# Patient Record
Sex: Male | Born: 1985 | Race: White | Hispanic: No | Marital: Married | Smoking: Never smoker
Health system: Southern US, Community
[De-identification: ages and names within clinical notes are randomized; demographics above are authoritative.]

## PROBLEM LIST (undated history)

## (undated) DIAGNOSIS — D734 Cyst of spleen: Secondary | ICD-10-CM

## (undated) DIAGNOSIS — K760 Fatty (change of) liver, not elsewhere classified: Secondary | ICD-10-CM

## (undated) DIAGNOSIS — K219 Gastro-esophageal reflux disease without esophagitis: Secondary | ICD-10-CM

## (undated) HISTORY — PX: NO PAST SURGERIES: SHX2092

---
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
## 2015-05-14 ENCOUNTER — Emergency Department
Admission: EM | Admit: 2015-05-14 | Discharge: 2015-05-14 | Disposition: A | Discharge status: Home / Self Care | Payer: Managed Care, Other (non HMO) | Attending: Emergency Medicine | Admitting: Emergency Medicine

## 2015-05-14 DIAGNOSIS — H6993 Unspecified Eustachian tube disorder, bilateral: Secondary | ICD-10-CM | POA: Diagnosis not present

## 2015-05-14 DIAGNOSIS — J309 Allergic rhinitis, unspecified: Secondary | ICD-10-CM | POA: Diagnosis not present

## 2015-05-14 DIAGNOSIS — H6983 Other specified disorders of Eustachian tube, bilateral: Secondary | ICD-10-CM

## 2015-05-14 DIAGNOSIS — H9203 Otalgia, bilateral: Secondary | ICD-10-CM | POA: Diagnosis present

## 2015-05-14 MED ORDER — FLUTICASONE PROPIONATE 50 MCG/ACT NA SUSP: 1.0000 | Freq: Two times a day (BID) | NASAL | Status: DC

## 2015-05-14 MED ORDER — CETIRIZINE HCL 10 MG PO TABS: 10.0000 mg | ORAL_TABLET | Freq: Every day | ORAL | Status: DC

## 2015-05-14 NOTE — Discharge Instructions (Signed)
Allergic Rhinitis Allergic rhinitis is when the mucous membranes in the nose respond to allergens. Allergens are particles in the air that cause your body to have an allergic reaction. This causes you to release allergic antibodies. Through a chain of events, these eventually cause you to release histamine into the blood stream. Although meant to protect the body, it is this release of histamine that causes your discomfort, such as frequent sneezing, congestion, and an itchy, runny nose.  CAUSES Seasonal allergic rhinitis (hay fever) is caused by pollen allergens that may come from grasses, trees, and weeds. Year-round allergic rhinitis (perennial allergic rhinitis) is caused by allergens such as house dust mites, pet dander, and mold spores. SYMPTOMS  Nasal stuffiness (congestion).  Itchy, runny nose with sneezing and tearing of the eyes. DIAGNOSIS Your health care provider can help you determine the allergen or allergens that trigger your symptoms. If you and your health care provider are unable to determine the allergen, skin or blood testing may be used. Your health care provider will diagnose your condition after taking your health history and performing a physical exam. Your health care provider may assess you for other related conditions, such as asthma, pink eye, or an ear infection. TREATMENT Allergic rhinitis does not have a cure, but it can be controlled by:  Medicines that block allergy symptoms. These may include allergy shots, nasal sprays, and oral antihistamines.  Avoiding the allergen. Hay fever may often be treated with antihistamines in pill or nasal spray forms. Antihistamines block the effects of histamine. There are over-the-counter medicines that may help with nasal congestion and swelling around the eyes. Check with your health care provider before taking or giving this medicine. If avoiding the allergen or the medicine prescribed do not work, there are many new medicines  your health care provider can prescribe. Stronger medicine may be used if initial measures are ineffective. Desensitizing injections can be used if medicine and avoidance does not work. Desensitization is when a patient is given ongoing shots until the body becomes less sensitive to the allergen. Make sure you follow up with your health care provider if problems continue. HOME CARE INSTRUCTIONS It is not possible to completely avoid allergens, but you can reduce your symptoms by taking steps to limit your exposure to them. It helps to know exactly what you are allergic to so that you can avoid your specific triggers. SEEK MEDICAL CARE IF:  You have a fever.  You develop a cough that does not stop easily (persistent).  You have shortness of breath.  You start wheezing.  Symptoms interfere with normal daily activities.   This information is not intended to replace advice given to you by your health care provider. Make sure you discuss any questions you have with your health care provider.   Document Released: 10/12/2000 Document Revised: 02/07/2014 Document Reviewed: 09/24/2012 Elsevier Interactive Patient Education 2016 [employer]     Barotitis Media Barotitis media is inflammation of your middle ear. This occurs when the auditory tube (eustachian tube) leading from the back of your nose (nasopharynx) to your eardrum is blocked. This blockage may result from a cold, environmental allergies, or an upper respiratory infection. Unresolved barotitis media may lead to damage or hearing loss (barotrauma), which may become permanent. HOME CARE INSTRUCTIONS   Use medicines as recommended by your health care provider. Over-the-counter medicines will help unblock the canal and can help during times of air travel.  Do not put anything into your ears to clean   or unplug them. Eardrops will not be helpful.  Do not swim, dive, or fly until your health care provider says it is all right to do so.  If these activities are necessary, chewing gum with frequent, forceful swallowing may help. It is also helpful to hold your nose and gently blow to pop your ears for equalizing pressure changes. This forces air into the eustachian tube.  Only take over-the-counter or prescription medicines for pain, discomfort, or fever as directed by your health care provider.  A decongestant may be helpful in decongesting the middle ear and make pressure equalization easier. SEEK MEDICAL CARE IF:  You experience a serious form of dizziness in which you feel as if the room is spinning and you feel nauseated (vertigo).  Your symptoms only involve one ear. SEEK IMMEDIATE MEDICAL CARE IF:   You develop a severe headache, dizziness, or severe ear pain.  You have bloody or pus-like drainage from your ears.  You develop a fever.  Your problems do not improve or become worse. MAKE SURE YOU:   Understand these instructions.  Will watch your condition.  Will get help right away if you are not doing well or get worse.   This information is not intended to replace advice given to you by your health care provider. Make sure you discuss any questions you have with your health care provider.   Document Released: 01/15/2000 Document Revised: 11/07/2012 Document Reviewed: 08/14/2012 Elsevier Interactive Patient Education 2016 [employer]vier Inc.  

## 2015-05-14 NOTE — ED Notes (Signed)
Pt reports left ear pain X 3 weeks. Pt reports having had similar pain of the right ear approx 1 year ago that was due to an abscessed tooth. Pt currently rates pain at a 3 out of 10 described as throbbing. Pt reports the pain goes intermittently to a 6-7. Pt reports slight loss of hearing in ear. Pt denies drainage, warmth, redness and fever.  Pt reports 2 episodes of emeses during the night - pt does not know if this is related to his symptoms.

## 2015-05-14 NOTE — ED Provider Notes (Signed)
[facility] Emergency Department Provider Note  ____________________________________________  Time seen: Approximately 10:39 PM  I have reviewed the triage vital signs and the nursing notes.   HISTORY  Chief Complaint Otalgia    HPI Dustin Love is a 30 y.o. male who presents emergency department complaining of intermittent left and right ear pain. Patient states that symptoms have been ongoing 3 weeks. Patient is concerned that "I might have an infection my mouth is causing pain in my ear. Patient denies any oral pain. He denies any oral swelling. Patient states that he has not had any fevers or chills, neck pain, chest pain, shortness of breath, nausea vomiting. Patient has not been taking any medications for this complaint. Patient denies any headache or visual acuity changes. He does endorse some seasonal allergies with nasal congestion and sneezing.   History reviewed. No pertinent past medical history.  There are no active problems to display for this patient.   History reviewed. No pertinent past surgical history.  Current Outpatient Rx  Name  Route  Sig  Dispense  Refill  . cetirizine (ZYRTEC) 10 MG tablet   Oral   Take 1 tablet (10 mg total) by mouth daily.   30 tablet   0   . fluticasone (FLONASE) 50 MCG/ACT nasal spray   Each Nare   Place 1 spray into both nostrils 2 (two) times daily.   16 g   0     Allergies Amoxicillin  History reviewed. No pertinent family history.  Social History Social History  Substance Use Topics  . Smoking status: Never Smoker   . Smokeless tobacco: None  . Alcohol Use: Yes     Comment: occasional drinking, nothing in over a month     Review of Systems  Constitutional: No fever/chills Eyes: No visual changes. No discharge ENT: No sore throat.Positive for nasal congestion. Positive for sneezing. Positive for bilateral intermittent ear pain. Cardiovascular: no chest pain. Respiratory: no  cough. No SOB. Musculoskeletal: Negative for back pain. Skin: Negative for rash. Neurological: Negative for headaches, focal weakness or numbness. 10-point ROS otherwise negative.  ____________________________________________   PHYSICAL EXAM:  VITAL SIGNS: ED Triage Vitals  Enc Vitals Group     BP 05/14/15 2150 150/82 mmHg     Pulse Rate 05/14/15 2150 68     Resp 05/14/15 2150 18     Temp 05/14/15 2150 97.8 F (36.6 C)     Temp Source 05/14/15 2150 Oral     SpO2 05/14/15 2150 100 %     Weight 05/14/15 2150 180 lb (81.647 kg)     Height 05/14/15 2150  (1.778 m)     Head Cir --      Peak Flow --      Pain Score 05/14/15 2213 3     Pain Loc --      Pain Edu? --      Excl. in GC? --      Constitutional: Alert and oriented. Well appearing and in no acute distress. Eyes: Conjunctivae are normal. PERRL. EOMI. Head: Atraumatic. ENT:      Ears: EACs are unremarkable bilaterally. TMs are mildly bulging. No air-fluid level noted.      Nose: Moderate congestion/rhinnorhea. Turbinates are boggy.      Mouth/Throat: Mucous membranes are moist. Oropharynx is not erythematous and not edematous. No signs of dental infection or abscess. Neck: No stridor.  Hematological/Lymphatic/Immunilogical: No cervical lymphadenopathy. Cardiovascular: Normal rate, regular rhythm. Normal S1 and S2.  Good peripheral circulation. Musculoskeletal: No lower extremity tenderness nor edema.  No joint effusions. Neurologic:  Normal speech and language. No gross focal neurologic deficits are appreciated.  Skin:  Skin is warm, dry and intact. No rash noted. Psychiatric: Mood and affect are normal. Speech and behavior are normal. Patient exhibits appropriate insight and judgement.   ____________________________________________   LABS (all labs ordered are listed, but only abnormal results are displayed)  Labs Reviewed - No data to  display ____________________________________________  EKG   ____________________________________________  RADIOLOGY   No results found.  ____________________________________________    PROCEDURES  Procedure(s) performed:       Medications - No data to display   ____________________________________________   INITIAL IMPRESSION / ASSESSMENT AND PLAN / ED COURSE  Pertinent labs & imaging results that were available during my care of the patient were reviewed by me and considered in my medical decision making (see chart for details).  Patient's diagnosis is consistent with allergic rhinitis with eustachian tube dysfunction. Patient will be discharged home with prescriptions for Zyrtec and Flonase. Patient is to follow up with primary care provider if symptoms persist past this treatment course. Patient is given ED precautions to return to the ED for any worsening or new symptoms.     ____________________________________________  FINAL CLINICAL IMPRESSION(S) / ED DIAGNOSES  Final diagnoses:  Allergic rhinitis, unspecified allergic rhinitis type  Eustachian tube dysfunction, bilateral      NEW MEDICATIONS STARTED DURING THIS VISIT:  New Prescriptions   CETIRIZINE (ZYRTEC) 10 MG TABLET    Take 1 tablet (10 mg total) by mouth daily.   FLUTICASONE (FLONASE) 50 MCG/ACT NASAL SPRAY    Place 1 spray into both nostrils 2 (two) times daily.        This chart was dictated using voice recognition software/Dragon. Despite best efforts to proofread, errors can occur which can change the meaning. Any change was purely unintentional.    Racheal PatchesJonathan D Rabab Currington, PA-C 05/14/15 2245  Phineas SemenGraydon Goodman, MD 05/14/15 2342

## 2015-05-14 NOTE — ED Notes (Signed)
Pt reports left ear pain radiating down the neck and up the head. Pt reports he thinks he has a infection of his left upper/lower molars. Pt also reports his daughter had a ear infection appro 3 weeks ago.

## 2015-08-16 ENCOUNTER — Encounter: Payer: Self-pay | Admitting: *Deleted

## 2015-08-16 ENCOUNTER — Emergency Department
Admission: EM | Admit: 2015-08-16 | Discharge: 2015-08-16 | Disposition: A | Discharge status: Home / Self Care | Payer: Managed Care, Other (non HMO) | Attending: Emergency Medicine | Admitting: Emergency Medicine

## 2015-08-16 DIAGNOSIS — R109 Unspecified abdominal pain: Secondary | ICD-10-CM | POA: Diagnosis present

## 2015-08-16 DIAGNOSIS — N200 Calculus of kidney: Secondary | ICD-10-CM | POA: Diagnosis not present

## 2015-08-16 DIAGNOSIS — Z79899 Other long term (current) drug therapy: Secondary | ICD-10-CM | POA: Insufficient documentation

## 2015-08-16 DIAGNOSIS — Z7951 Long term (current) use of inhaled steroids: Secondary | ICD-10-CM | POA: Insufficient documentation

## 2015-08-16 LAB — URINALYSIS COMPLETE WITH MICROSCOPIC (ARMC ONLY)
Bilirubin Urine: NEGATIVE
GLUCOSE, UA: NEGATIVE mg/dL
KETONES UR: NEGATIVE mg/dL
LEUKOCYTES UA: NEGATIVE
NITRITE: NEGATIVE
Protein, ur: NEGATIVE mg/dL
SPECIFIC GRAVITY, URINE: 1.016 (ref 1.005–1.030)
SQUAMOUS EPITHELIAL / LPF: NONE SEEN
pH: 7 (ref 5.0–8.0)

## 2015-08-16 LAB — BASIC METABOLIC PANEL
Anion gap: 5 (ref 5–15)
BUN: 10 mg/dL (ref 6–20)
CHLORIDE: 103 mmol/L (ref 101–111)
CO2: 30 mmol/L (ref 22–32)
CREATININE: 1.23 mg/dL (ref 0.61–1.24)
Calcium: 9.5 mg/dL (ref 8.9–10.3)
GFR calc Af Amer: >60 mL/min (ref >60)
GFR calc non Af Amer: >60 mL/min (ref >60)
GLUCOSE: 87 mg/dL (ref 65–99)
POTASSIUM: 4 mmol/L (ref 3.5–5.1)
SODIUM: 138 mmol/L (ref 135–145)

## 2015-08-16 LAB — CBC
HEMATOCRIT: 47.6 % (ref 40.0–52.0)
Hemoglobin: 16.6 g/dL (ref 13.0–18.0)
MCH: 30 pg (ref 26.0–34.0)
MCHC: 34.8 g/dL (ref 32.0–36.0)
MCV: 86.3 fL (ref 80.0–100.0)
PLATELETS: 161 10*3/uL (ref 150–440)
RBC: 5.51 MIL/uL (ref 4.40–5.90)
RDW: 13.2 % (ref 11.5–14.5)
WBC: 6.2 10*3/uL (ref 3.8–10.6)

## 2015-08-16 NOTE — ED Provider Notes (Addendum)
[facility] Emergency Department Provider Note   ____________________________________________  Time seen: Approximately 915 PM  I have reviewed the triage vital signs and the nursing notes.   HISTORY  Chief Complaint Flank Pain   HPI Dustin Love is a 30 y.o. male without any chronic medical conditions was presenting to the emergency department today with left flank pain that started suddenly at 12 PM. He says it then dissipated but then returned later in the day it is severe, cramping 10 out of 10 pain. He says that at that point he came to the emergency department. He said that he then had a feeling of a sharp pain at the tip of his penis and he went to urinate and the pain was relieved. He has never had a history of kidney stone. He does not have any family history of kidney stones. Did not report any fever. Says that he has had no pain since. Did not notice any blood in his urine.   History reviewed. No pertinent past medical history.  There are no active problems to display for this patient.   History reviewed. No pertinent past surgical history.  Current Outpatient Rx  Name  Route  Sig  Dispense  Refill  . cetirizine (ZYRTEC) 10 MG tablet   Oral   Take 1 tablet (10 mg total) by mouth daily.   30 tablet   0   . fluticasone (FLONASE) 50 MCG/ACT nasal spray   Each Nare   Place 1 spray into both nostrils 2 (two) times daily.   16 g   0     Allergies Amoxicillin  History reviewed. No pertinent family history.  Social History Social History  Substance Use Topics  . Smoking status: Never Smoker   . Smokeless tobacco: Never Used  . Alcohol Use: Yes     Comment: occasional drinking, nothing in over a month    Review of Systems Constitutional: No fever/chills Eyes: No visual changes. ENT: No sore throat. Cardiovascular: Denies chest pain. Respiratory: Denies shortness of breath. Gastrointestinal: No abdominal pain.  No  nausea, no vomiting.  No diarrhea.  No constipation. Genitourinary: Negative for dysuria. Musculoskeletal: As above Skin: Negative for rash. Neurological: Negative for headaches, focal weakness or numbness.  10-point ROS otherwise negative.  ____________________________________________   PHYSICAL EXAM:  VITAL SIGNS: ED Triage Vitals  Enc Vitals Group     BP 08/16/15 1928 149/91 mmHg     Pulse Rate 08/16/15 1928 70     Resp 08/16/15 1928 28     Temp 08/16/15 1928 98.2 F (36.8 C)     Temp Source 08/16/15 1928 Oral     SpO2 08/16/15 1928 100 %     Weight 08/16/15 1928 180 lb (81.647 kg)     Height 08/16/15 1928 5\' 11"  (1.803 m)     Head Cir --      Peak Flow --      Pain Score 08/16/15 1928 0     Pain Loc --      Pain Edu? --      Excl. in GC? --     Constitutional: Alert and oriented. Well appearing and in no acute distress. Eyes: Conjunctivae are normal. PERRL. EOMI. Head: Atraumatic. Nose: No congestion/rhinnorhea. Mouth/Throat: Mucous membranes are moist.   Neck: No stridor.   Cardiovascular: Normal rate, regular rhythm. Grossly normal heart sounds.   Respiratory: Normal respiratory effort.  No retractions. Lungs CTAB. Gastrointestinal: Soft and nontender. No distention.  No CVA tenderness. Genitourinary: Normal external appearance. No swelling or deformity. Circumcised male. No testicular swelling or tenderness. Musculoskeletal: No lower extremity tenderness nor edema.  No joint effusions. Neurologic:  Normal speech and language. No gross focal neurologic deficits are appreciated. No gait instability. Skin:  Skin is warm, dry and intact. No rash noted. Psychiatric: Mood and affect are normal. Speech and behavior are normal.  ____________________________________________   LABS (all labs ordered are listed, but only abnormal results are displayed)  Labs Reviewed  URINALYSIS COMPLETEWITH MICROSCOPIC ([facility] ONLY) - Abnormal; Notable for the following:    Color,  Urine YELLOW (*)    APPearance HAZY (*)    Hgb urine dipstick 3+ (*)    Bacteria, UA RARE (*)    All other components within normal limits  BASIC METABOLIC PANEL  CBC   ____________________________________________  EKG   ____________________________________________  RADIOLOGY   ____________________________________________   PROCEDURES   Procedures   ____________________________________________   INITIAL IMPRESSION / ASSESSMENT AND PLAN / ED COURSE  Pertinent labs & imaging results that were available during my care of the patient were reviewed by me and considered in my medical decision making (see chart for details).  Patient with history consistent with kidney stone with passage. Blood in the urine without signs of urinary tract infection. Very reassuring lab work. Will be discharged home. ____________________________________________   FINAL CLINICAL IMPRESSION(S) / ED DIAGNOSES  Kidney stone.    NEW MEDICATIONS STARTED DURING THIS VISIT:  New Prescriptions   No medications on file     Note:  This document was prepared using Dragon voice recognition software and may include unintentional dictation errors.    Myrna Blazer, MD 08/16/15 2132  rr 18 on my exam   Myrna Blazer, MD 08/16/15 2149

## 2015-08-16 NOTE — ED Notes (Signed)
Pt c/o L flank pain waking from sleep at noon. Pt c/o sensation of having to urinate that persists after urination. Pt states intermittent pain that is severe when having it. Pt denies n/v. Pt states he was uncomfortable in any position. Pt states he urinated 5 minutes ago and his pain was relieved.

## 2015-08-16 NOTE — Discharge Instructions (Signed)
Kidney Stones °Kidney stones (urolithiasis) are deposits that form inside your kidneys. The intense pain is caused by the stone moving through the urinary tract. When the stone moves, the ureter goes into spasm around the stone. The stone is usually passed in the urine.  °CAUSES  °· A disorder that makes certain neck glands produce too much parathyroid hormone (primary hyperparathyroidism). °· A buildup of uric acid crystals, similar to gout in your joints. °· Narrowing (stricture) of the ureter. °· A kidney obstruction present at birth (congenital obstruction). °· Previous surgery on the kidney or ureters. °· Numerous kidney infections. °SYMPTOMS  °· Feeling sick to your stomach (nauseous). °· Throwing up (vomiting). °· Blood in the urine (hematuria). °· Pain that usually spreads (radiates) to the groin. °· Frequency or urgency of urination. °DIAGNOSIS  °· Taking a history and physical exam. °· Blood or urine tests. °· CT scan. °· Occasionally, an examination of the inside of the urinary bladder (cystoscopy) is performed. °TREATMENT  °· Observation. °· Increasing your fluid intake. °· Extracorporeal shock wave lithotripsy--This is a noninvasive procedure that uses shock waves to break up kidney stones. °· Surgery may be needed if you have severe pain or persistent obstruction. There are various surgical procedures. Most of the procedures are performed with the use of small instruments. Only small incisions are needed to accommodate these instruments, so recovery time is minimized. °The size, location, and chemical composition are all important variables that will determine the proper choice of action for you. Talk to your health care provider to better understand your situation so that you will minimize the risk of injury to yourself and your kidney.  °HOME CARE INSTRUCTIONS  °· Drink enough water and fluids to keep your urine clear or pale yellow. This will help you to pass the stone or stone fragments. °· Strain  all urine through the provided strainer. Keep all particulate matter and stones for your health care provider to see. The stone causing the pain may be as small as a grain of salt. It is very important to use the strainer each and every time you pass your urine. The collection of your stone will allow your health care provider to analyze it and verify that a stone has actually passed. The stone analysis will often identify what you can do to reduce the incidence of recurrences. °· Only take over-the-counter or prescription medicines for pain, discomfort, or fever as directed by your health care provider. °· Keep all follow-up visits as told by your health care provider. This is important. °· Get follow-up X-rays if required. The absence of pain does not always mean that the stone has passed. It may have only stopped moving. If the urine remains completely obstructed, it can cause loss of kidney function or even complete destruction of the kidney. It is your responsibility to make sure X-rays and follow-ups are completed. Ultrasounds of the kidney can show blockages and the status of the kidney. Ultrasounds are not associated with any radiation and can be performed easily in a matter of minutes. °· Make changes to your daily diet as told by your health care provider. You may be told to: °¨ Limit the amount of salt that you eat. °¨ Eat 5 or more servings of fruits and vegetables each day. °¨ Limit the amount of meat, poultry, fish, and eggs that you eat. °· Collect a 24-hour urine sample as told by your health care provider. You may need to collect another urine sample every 6-12   months. °SEEK MEDICAL CARE IF: °· You experience pain that is progressive and unresponsive to any pain medicine you have been prescribed. °SEEK IMMEDIATE MEDICAL CARE IF:  °· Pain cannot be controlled with the prescribed medicine. °· You have a fever or shaking chills. °· The severity or intensity of pain increases over 18 hours and is not  relieved by pain medicine. °· You develop a new onset of abdominal pain. °· You feel faint or pass out. °· You are unable to urinate. °  °This information is not intended to replace advice given to you by your health care provider. Make sure you discuss any questions you have with your health care provider. °  °Document Released: 01/17/2005 Document Revised: 10/08/2014 Document Reviewed: 06/20/2012 °Elsevier Interactive Patient Education ©2016 [employer] Inc. ° °

## 2016-05-31 ENCOUNTER — Ambulatory Visit
Admission: EM | Admit: 2016-05-31 | Discharge: 2016-05-31 | Disposition: A | Discharge status: Home / Self Care | Payer: Commercial Managed Care - PPO | Attending: Family Medicine | Admitting: Family Medicine

## 2016-05-31 ENCOUNTER — Ambulatory Visit (INDEPENDENT_AMBULATORY_CARE_PROVIDER_SITE_OTHER): Payer: Commercial Managed Care - PPO

## 2016-05-31 DIAGNOSIS — H6501 Acute serous otitis media, right ear: Secondary | ICD-10-CM | POA: Diagnosis not present

## 2016-05-31 DIAGNOSIS — R05 Cough: Secondary | ICD-10-CM

## 2016-05-31 DIAGNOSIS — K219 Gastro-esophageal reflux disease without esophagitis: Secondary | ICD-10-CM

## 2016-05-31 DIAGNOSIS — R059 Cough, unspecified: Secondary | ICD-10-CM

## 2016-05-31 DIAGNOSIS — H65111 Acute and subacute allergic otitis media (mucoid) (sanguinous) (serous), right ear: Secondary | ICD-10-CM

## 2016-05-31 MED ORDER — PANTOPRAZOLE SODIUM 40 MG PO TBEC: 40.0000 mg | DELAYED_RELEASE_TABLET | Freq: Every day | ORAL | 0 refills | Status: DC

## 2016-05-31 NOTE — ED Triage Notes (Signed)
Patient complains of cough. Patient states that cough is constant x months. Patient states that he will cough after laughing and other instances. Patient states that cough is dry and occasionally causes nausea.

## 2016-05-31 NOTE — ED Provider Notes (Signed)
CSN: [id number]     Arrival date & time 05/31/16  0945 History   First MD Initiated Contact with Patient 05/31/16 [phone]     Chief Complaint  Patient presents with  . Cough   (Consider location/radiation/quality/duration/timing/severity/associated sxs/prior Treatment) HPI  31 year old male who presents with a cough has been constant for the last several months. The patient states he is actually had this since childhood has never had any workup regarding it. He has tried several things on his own. He was having burning in the back of his throat when he lie down which she thought was from soft drinks started taking acid reducers and this seemed to alleviate the problem of the burning but he continued to cough. Recently he has noticed that whenever he laughs for a prolonged period and will trigger a coughing paroxysm that will actually cause him to gag . His cough is dry. He has no history of cigarette smoking. He states that he has not lost weight recently in fact has gained weight. He has tried to decrease his soda intake and is actually down to 2 [facility] daily. Is no shortness of breath. His O2 sats are 100% on room air. Complaining of increased sinus issues.      History reviewed. No pertinent past medical history. Past Surgical History:  Procedure Laterality Date  . NO PAST SURGERIES     Family History  Problem Relation Age of Onset  . Diabetes Father   . Hypertension Father    Social History  Substance Use Topics  . Smoking status: Never Smoker  . Smokeless tobacco: Never Used  . Alcohol use Yes     Comment: occasional drinking, nothing in over a month    Review of Systems  Constitutional: Negative for activity change, appetite change, chills, fatigue and fever.  Respiratory: Positive for cough. Negative for shortness of breath, wheezing and stridor.   All other systems reviewed and are negative.   Allergies  Amoxicillin  Home Medications   Prior to Admission  medications   Medication Sig Start Date End Date Taking? Authorizing Provider  pantoprazole (PROTONIX) 40 MG tablet Take 1 tablet (40 mg total) by mouth daily. 05/31/16   Lutricia Feil, PA-C   Meds Ordered and Administered this Visit  Medications - No data to display  BP 140/71 (BP Location: Left Arm)   Pulse 88   Temp 98.2 F (36.8 C) (Oral)   Resp 18   Ht  (1.803 m)   Wt 175 lb (79.4 kg)   SpO2 100%   BMI 24.41 kg/m  No data found.   Physical Exam  Constitutional: He is oriented to person, place, and time. He appears well-developed and well-nourished. No distress.  HENT:  Head: Normocephalic and atraumatic.  Left Ear: External ear normal.  Nose: Nose normal.  Mouth/Throat: Oropharynx is clear and moist. No oropharyngeal exudate.  Examination of the right ear shows it to be very erythematous and bulging. Left TM appears normal.  Eyes: Pupils are equal, round, and reactive to light. Right eye exhibits no discharge. Left eye exhibits no discharge.  Neck: Normal range of motion.  Pulmonary/Chest: Effort normal and breath sounds normal. No respiratory distress. He has no wheezes. He has no rales.  Musculoskeletal: Normal range of motion.  Lymphadenopathy:    He has no cervical adenopathy.  Neurological: He is alert and oriented to person, place, and time.  Skin: Skin is warm and dry. He is not diaphoretic.  Psychiatric: He  has a normal mood and affect. His behavior is normal. Judgment and thought content normal.  Nursing note and vitals reviewed.   Urgent Care Course     Procedures (including critical care time)  Labs Review Labs Reviewed - No data to display  Imaging Review Dg Chest 2 View  Result Date: 05/31/2016 CLINICAL DATA:  Chronic cough EXAM: CHEST  2 VIEW COMPARISON:  None. FINDINGS: The heart size and mediastinal contours are within normal limits. Both lungs are clear. The visualized skeletal structures are unremarkable. IMPRESSION: No active  cardiopulmonary disease. Electronically Signed   By: Alcide Clever M.D.   On: 05/31/2016 10:53     Visual Acuity Review  Right Eye Distance:   Left Eye Distance:   Bilateral Distance:    Right Eye Near:   Left Eye Near:    Bilateral Near:         MDM   1. Cough   2. Gastroesophageal reflux disease, esophagitis presence not specified   3. Acute allergic serous otitis media of right ear    Discharge Medication List as of 05/31/2016 11:19 AM    START taking these medications   Details  pantoprazole (PROTONIX) 40 MG tablet Take 1 tablet (40 mg total) by mouth daily., Starting Tue 05/31/2016, Normal      Plan: 1. Test/x-ray results and diagnosis reviewed with patient 2. rx as per orders; risks, benefits, potential side effects reviewed with patient 3. Recommend supportive treatment with Following instructions provided to him for GERD and for cough. Change diet accordingly. Also recommended that he elevate the head of his bed between 10 and 15 off flat and to not eat late of before going to bed. He will require further evaluation and he will arrange an appointment with a primary care physician at [facility] primary which is his choice. So has allergic sinusitis and serous otitis media. I recommended that he use Flonase and Zyrtec throughout the spring and fall. At this point he does not require antibiotic therapy but that can be decided when he follows up with his primary care physician. 4. F/u prn if symptoms worsen or don't improve     Lutricia Feil, PA-C 05/31/16 1143    Lutricia Feil, PA-C 05/31/16 1144

## 2017-01-06 ENCOUNTER — Emergency Department
Admission: EM | Admit: 2017-01-06 | Discharge: 2017-01-06 | Disposition: A | Discharge status: Home / Self Care | Payer: Commercial Managed Care - PPO | Attending: Emergency Medicine | Admitting: Emergency Medicine

## 2017-01-06 DIAGNOSIS — A0811 Acute gastroenteropathy due to Norwalk agent: Secondary | ICD-10-CM | POA: Insufficient documentation

## 2017-01-06 DIAGNOSIS — Z79899 Other long term (current) drug therapy: Secondary | ICD-10-CM | POA: Diagnosis not present

## 2017-01-06 DIAGNOSIS — R112 Nausea with vomiting, unspecified: Secondary | ICD-10-CM | POA: Diagnosis present

## 2017-01-06 LAB — GASTROINTESTINAL PANEL BY PCR, STOOL (REPLACES STOOL CULTURE)
Adenovirus F40/41: NOT DETECTED
Astrovirus: NOT DETECTED
Campylobacter species: NOT DETECTED
Cryptosporidium: NOT DETECTED
Cyclospora cayetanensis: NOT DETECTED
ENTAMOEBA HISTOLYTICA: NOT DETECTED
ENTEROAGGREGATIVE E COLI (EAEC): NOT DETECTED
Enteropathogenic E coli (EPEC): NOT DETECTED
Enterotoxigenic E coli (ETEC): NOT DETECTED
GIARDIA LAMBLIA: NOT DETECTED
NOROVIRUS GI/GII: DETECTED — AB
Plesimonas shigelloides: NOT DETECTED
Rotavirus A: NOT DETECTED
SALMONELLA SPECIES: NOT DETECTED
SHIGELLA/ENTEROINVASIVE E COLI (EIEC): NOT DETECTED
Sapovirus (I, II, IV, and V): NOT DETECTED
Shiga like toxin producing E coli (STEC): NOT DETECTED
VIBRIO CHOLERAE: NOT DETECTED
VIBRIO SPECIES: NOT DETECTED
YERSINIA ENTEROCOLITICA: NOT DETECTED

## 2017-01-06 LAB — CBC WITH DIFFERENTIAL/PLATELET
BASOS PCT: 0 %
Basophils Absolute: 0 10*3/uL (ref 0–0.1)
Eosinophils Absolute: 0 10*3/uL (ref 0–0.7)
Eosinophils Relative: 0 %
HEMATOCRIT: 36.6 % — AB (ref 40.0–52.0)
HEMOGLOBIN: 12.6 g/dL — AB (ref 13.0–18.0)
Lymphocytes Relative: 2 %
Lymphs Abs: 0.2 10*3/uL — ABNORMAL LOW (ref 1.0–3.6)
MCH: 30 pg (ref 26.0–34.0)
MCHC: 34.2 g/dL (ref 32.0–36.0)
MCV: 87.5 fL (ref 80.0–100.0)
Monocytes Absolute: 0.4 10*3/uL (ref 0.2–1.0)
Monocytes Relative: 5 %
NEUTROS ABS: 7.8 10*3/uL — AB (ref 1.4–6.5)
Neutrophils Relative %: 93 %
Platelets: 148 10*3/uL — ABNORMAL LOW (ref 150–440)
RBC: 4.19 MIL/uL — ABNORMAL LOW (ref 4.40–5.90)
RDW: 13 % (ref 11.5–14.5)
WBC: 8.4 10*3/uL (ref 3.8–10.6)

## 2017-01-06 LAB — COMPREHENSIVE METABOLIC PANEL
ALBUMIN: 4.8 g/dL (ref 3.5–5.0)
ALK PHOS: 85 U/L (ref 38–126)
ALT: 78 U/L — ABNORMAL HIGH (ref 17–63)
AST: 43 U/L — AB (ref 15–41)
Anion gap: 16 — ABNORMAL HIGH (ref 5–15)
BILIRUBIN TOTAL: 1.4 mg/dL — AB (ref 0.3–1.2)
BUN: 15 mg/dL (ref 6–20)
CO2: 23 mmol/L (ref 22–32)
Calcium: 10.1 mg/dL (ref 8.9–10.3)
Chloride: 103 mmol/L (ref 101–111)
Creatinine, Ser: 1.02 mg/dL (ref 0.61–1.24)
GFR calc Af Amer: >60 mL/min (ref >60)
GFR calc non Af Amer: >60 mL/min (ref >60)
GLUCOSE: 161 mg/dL — AB (ref 65–99)
POTASSIUM: 3.6 mmol/L (ref 3.5–5.1)
Sodium: 142 mmol/L (ref 135–145)
TOTAL PROTEIN: 8.4 g/dL — AB (ref 6.5–8.1)

## 2017-01-06 LAB — C DIFFICILE QUICK SCREEN W PCR REFLEX
C DIFFICLE (CDIFF) ANTIGEN: NEGATIVE
C Diff interpretation: NOT DETECTED
C Diff toxin: NEGATIVE

## 2017-01-06 LAB — LIPASE, BLOOD: Lipase: 27 U/L (ref 11–51)

## 2017-01-06 MED ORDER — ONDANSETRON HCL 4 MG/2ML IJ SOLN
4.0000 mg | Freq: Once | INTRAMUSCULAR | Status: AC
  Administered 2017-01-06: 4 mg via INTRAVENOUS

## 2017-01-06 MED ORDER — ONDANSETRON HCL 4 MG/2ML IJ SOLN
INTRAMUSCULAR | Status: AC
  Filled 2017-01-06: qty 2

## 2017-01-06 MED ORDER — ALUM & MAG HYDROXIDE-SIMETH 200-200-20 MG/5ML PO SUSP
ORAL | Status: AC
  Administered 2017-01-06: 30 mL
  Filled 2017-01-06: qty 30

## 2017-01-06 MED ORDER — ONDANSETRON 8 MG PO TBDP: 8.0000 mg | ORAL_TABLET | Freq: Three times a day (TID) | ORAL | 0 refills | Status: DC | PRN

## 2017-01-06 MED ORDER — LOPERAMIDE HCL 2 MG PO CAPS
4.0000 mg | ORAL_CAPSULE | Freq: Once | ORAL | Status: AC
  Administered 2017-01-06: 4 mg via ORAL
  Filled 2017-01-06: qty 2

## 2017-01-06 MED ORDER — GI COCKTAIL ~~LOC~~
30.0000 mL | Freq: Once | ORAL | Status: AC
  Administered 2017-01-06: 30 mL via ORAL
  Filled 2017-01-06: qty 30

## 2017-01-06 MED ORDER — ONDANSETRON HCL 4 MG/2ML IJ SOLN
INTRAMUSCULAR | Status: AC
  Administered 2017-01-06: 4 mg via INTRAVENOUS
  Filled 2017-01-06: qty 2

## 2017-01-06 MED ORDER — LOPERAMIDE HCL 2 MG PO TABS: 2.0000 mg | ORAL_TABLET | Freq: Four times a day (QID) | ORAL | 0 refills | Status: DC | PRN

## 2017-01-06 MED ORDER — SODIUM CHLORIDE 0.9 % IV BOLUS (SEPSIS)
2000.0000 mL | Freq: Once | INTRAVENOUS | Status: AC
  Administered 2017-01-06: 2000 mL via INTRAVENOUS

## 2017-01-06 NOTE — ED Provider Notes (Signed)
[facility] Emergency Department Provider Note    First MD Initiated Contact with Patient 01/06/17 [phone]     (approximate)  I have reviewed the triage vital signs and the nursing notes.   HISTORY  Chief Complaint Emesis; Nausea; and Diarrhea    HPI Dustin Love is a 31 y.o. male presents to the emergency department with 1 day history of nausea vomiting and nonbloody.  Patient states that symptoms began 2 PM yesterday afternoon and since that time he has been unable to tolerate eating or drinking.  Patient denies any abdominal pain.  Patient denies any urinary symptoms.  Patient states that his 742-year-old daughter began having symptoms as well tonight consistent with his.   Past medical history No pertinent past medical history There are no active problems to display for this patient.   Past Surgical History:  Procedure Laterality Date  . NO PAST SURGERIES      Prior to Admission medications   Medication Sig Start Date End Date Taking? Authorizing Provider  pantoprazole (PROTONIX) 40 MG tablet Take 1 tablet (40 mg total) by mouth daily. 05/31/16   Lutricia Feiloemer, William P, PA-C    Allergies Amoxicillin  Family History  Problem Relation Age of Onset  . Diabetes Father   . Hypertension Father     Social History Social History   Tobacco Use  . Smoking status: Never Smoker  . Smokeless tobacco: Never Used  Substance Use Topics  . Alcohol use: Yes    Comment: occasional drinking, nothing in over a month  . Drug use: No    Review of Systems Constitutional: No fever/chills Eyes: No visual changes. ENT: No sore throat. Cardiovascular: Denies chest pain. Respiratory: Denies shortness of breath. Gastrointestinal: No abdominal pain.  Positive for nausea vomiting and diarrhea  genitourinary: Negative for dysuria. Musculoskeletal: Negative for neck pain.  Negative for back pain. Integumentary: Negative for rash. Neurological: Negative for  headaches, focal weakness or numbness.  ____________________________________________   PHYSICAL EXAM:  VITAL SIGNS: ED Triage Vitals  Enc Vitals Group     BP 01/06/17 0228 (!) 131/100     Pulse Rate 01/06/17 0228 (!) 101     Resp 01/06/17 0228 20     Temp 01/06/17 0228 98.9 F (37.2 C)     Temp Source 01/06/17 0228 Oral     SpO2 01/06/17 0219 99 %     Weight 01/06/17 0229 81.6 kg (180 lb)     Height 01/06/17 0229 1.803 m (5\' 11" )     Head Circumference --      Peak Flow --      Pain Score 01/06/17 0227 10     Pain Loc --      Pain Edu? --      Excl. in GC? --     Constitutional: Alert and oriented. Well appearing and in no acute distress. Eyes: Conjunctivae are normal.  Head: Atraumatic. Mouth/Throat: Mucous membranes are moist.  Oropharynx non-erythematous. Neck: No stridor. Cardiovascular: Normal rate, regular rhythm. Good peripheral circulation. Grossly normal heart sounds. Respiratory: Normal respiratory effort.  No retractions. Lungs CTAB. Gastrointestinal: Soft and nontender. No distention.  Musculoskeletal: No lower extremity tenderness nor edema. No gross deformities of extremities. Neurologic:  Normal speech and language. No gross focal neurologic deficits are appreciated.  Skin:  Skin is warm, dry and intact. No rash noted. Psychiatric: Mood and affect are normal. Speech and behavior are normal.  ____________________________________________   LABS (all labs ordered are listed,  but only abnormal results are displayed)  Labs Reviewed  CBC WITH DIFFERENTIAL/PLATELET - Abnormal; Notable for the following components:      Result Value   RBC 4.19 (*)    Hemoglobin 12.6 (*)    HCT 36.6 (*)    Platelets 148 (*)    Neutro Abs 7.8 (*)    Lymphs Abs 0.2 (*)    All other components within normal limits  COMPREHENSIVE METABOLIC PANEL - Abnormal; Notable for the following components:   Glucose, Bld 161 (*)    Total Protein 8.4 (*)    AST 43 (*)    ALT 78 (*)     Total Bilirubin 1.4 (*)    Anion gap 16 (*)    All other components within normal limits  C DIFFICILE QUICK SCREEN W PCR REFLEX  GASTROINTESTINAL PANEL BY PCR, STOOL (REPLACES STOOL CULTURE)  LIPASE, BLOOD   _____   Procedures   ____________________________________________   INITIAL IMPRESSION / ASSESSMENT AND PLAN / ED COURSE  As part of my medical decision making, I reviewed the following data within the electronic MEDICAL RECORD [age] year old male presenting with above-stated history and physical exam consistent with infectious gastroenteritis.  Patient given 2 L IV normal saline as well as IV and Imodium.  Stool cultures consistent with Arna Mediciora virus.  Patient notified of all clinical findings.  Patient will be discharged home with Zofran and Imodium.  Spoke with patient at length regarding oral hydration. ____________________________________________  FINAL CLINICAL IMPRESSION(S) / ED DIAGNOSES  Final diagnoses:  Norovirus     MEDICATIONS GIVEN DURING THIS VISIT:  Medications  loperamide (IMODIUM) capsule 4 mg (not administered)  sodium chloride 0.9 % bolus 2,000 mL (2,000 mLs Intravenous New Bag/Given 01/06/17 0233)  ondansetron (ZOFRAN) injection 4 mg (4 mg Intravenous Given 01/06/17 0233)  alum & mag hydroxide-simeth (MAALOX/MYLANTA) 200-200-20 MG/5ML suspension (30 mLs  Given 01/06/17 0233)  gi cocktail (Maalox,Lidocaine,Donnatal) (30 mLs Oral Given 01/06/17 [id number])     ED Discharge Orders    None       Note:  This document was prepared using Dragon voice recognition software and may include unintentional dictation errors.    Darci CurrentBrown, Barry N, MD 01/06/17 [phone]

## 2017-01-06 NOTE — ED Notes (Signed)
Patient brought soda and graham crackers by this EDT.

## 2017-01-06 NOTE — ED Triage Notes (Signed)
Pt arrives via ems from home with complaints of nausea, vomiting and diarrhea. EMS reports pt started getting sick around 2pm on 12/6. Pt states "unable to keep even a sip of water down". Pt currently in no obvious distress.

## 2017-01-12 ENCOUNTER — Ambulatory Visit
Admission: EM | Admit: 2017-01-12 | Discharge: 2017-01-12 | Disposition: A | Discharge status: Home / Self Care | Payer: Commercial Managed Care - PPO | Attending: Family Medicine | Admitting: Family Medicine

## 2017-01-12 ENCOUNTER — Other Ambulatory Visit: Payer: Self-pay

## 2017-01-12 ENCOUNTER — Ambulatory Visit (INDEPENDENT_AMBULATORY_CARE_PROVIDER_SITE_OTHER): Payer: Commercial Managed Care - PPO

## 2017-01-12 DIAGNOSIS — M5489 Other dorsalgia: Secondary | ICD-10-CM

## 2017-01-12 DIAGNOSIS — R109 Unspecified abdominal pain: Secondary | ICD-10-CM

## 2017-01-12 DIAGNOSIS — N200 Calculus of kidney: Secondary | ICD-10-CM | POA: Diagnosis not present

## 2017-01-12 DIAGNOSIS — R110 Nausea: Secondary | ICD-10-CM

## 2017-01-12 LAB — URINALYSIS, COMPLETE (UACMP) WITH MICROSCOPIC
BACTERIA UA: NONE SEEN
Bilirubin Urine: NEGATIVE
Glucose, UA: NEGATIVE mg/dL
KETONES UR: NEGATIVE mg/dL
Leukocytes, UA: NEGATIVE
Nitrite: NEGATIVE
PH: 7 (ref 5.0–8.0)
Protein, ur: NEGATIVE mg/dL
SPECIFIC GRAVITY, URINE: 1.02 (ref 1.005–1.030)
Squamous Epithelial / LPF: NONE SEEN

## 2017-01-12 MED ORDER — PROMETHAZINE HCL 25 MG/ML IJ SOLN
25.0000 mg | Freq: Once | INTRAMUSCULAR | Status: AC
  Administered 2017-01-12: 25 mg via INTRAMUSCULAR

## 2017-01-12 MED ORDER — HYDROCODONE-ACETAMINOPHEN 5-325 MG PO TABS: 1.0000 | ORAL_TABLET | Freq: Three times a day (TID) | ORAL | 0 refills | Status: DC | PRN

## 2017-01-12 MED ORDER — TAMSULOSIN HCL 0.4 MG PO CAPS: 0.4000 mg | ORAL_CAPSULE | Freq: Every day | ORAL | 0 refills | Status: DC

## 2017-01-12 MED ORDER — KETOROLAC TROMETHAMINE 60 MG/2ML IM SOLN
60.0000 mg | Freq: Once | INTRAMUSCULAR | Status: AC
  Administered 2017-01-12: 60 mg via INTRAMUSCULAR

## 2017-01-12 NOTE — ED Provider Notes (Signed)
[facility]  CSN: [id number] Arrival date & time: 01/12/17  1501  History   Chief Complaint Chief Complaint  Patient presents with  . Back Pain   HPI  31 year old male presents with severe flank pain.  He is concerned about kidney stone.  Started abruptly this morning around 8 AM.  He has had severe intermittent flank pain.  Radiation anteriorly.  No gross hematuria.  He is taken ibuprofen without resolution in his pain.  His pain is severe.  He is also taken a muscle relaxer without improvement.  He states that this feels like his prior stone except more severe.  Nausea but no vomiting.  No other associated symptoms.  Patient cannot seem to get comfortable.  No known exacerbating relieving factors.  No other associated symptoms.  No other complaints at this time.  PMH - History of kidney stone.  Past Surgical History:  Procedure Laterality Date  . NO PAST SURGERIES     Home Medications    Prior to Admission medications   Medication Sig Start Date End Date Taking? Authorizing Provider  HYDROcodone-acetaminophen (NORCO/VICODIN) 5-325 MG tablet Take 1 tablet by mouth every 8 (eight) hours as needed for moderate pain. 01/12/17   Tommie Samsook, Ezme Duch G, DO  tamsulosin (FLOMAX) 0.4 MG CAPS capsule Take 1 capsule (0.4 mg total) by mouth daily. 01/12/17   Tommie Samsook, Baneen Wieseler G, DO   Family History Family History  Problem Relation Age of Onset  . Diabetes Father   . Hypertension Father     Social History Social History   Tobacco Use  . Smoking status: Never Smoker  . Smokeless tobacco: Never Used  Substance Use Topics  . Alcohol use: Yes    Comment: occasional drinking, nothing in over a month  . Drug use: No     Allergies   Amoxicillin   Review of Systems Review of Systems  Gastrointestinal: Positive for nausea.  Genitourinary: Positive for flank pain.  All other systems reviewed and are negative.  Physical Exam Triage Vital Signs ED Triage Vitals  Enc Vitals Group       BP 01/12/17 1516 (!) 149/86     Pulse Rate 01/12/17 1516 75     Resp 01/12/17 1516 18     Temp 01/12/17 1516 (!) 97.5 F (36.4 C)     Temp Source 01/12/17 1516 Oral     SpO2 01/12/17 1516 99 %     Weight 01/12/17 1514 180 lb (81.6 kg)     Height 01/12/17 1514 5\' 11"  (1.803 m)     Head Circumference --      Peak Flow --      Pain Score 01/12/17 1514 10     Pain Loc --      Pain Edu? --      Excl. in GC? --    Updated Vital Signs BP (!) 149/86 (BP Location: Left Arm)   Pulse 75   Temp (!) 97.5 F (36.4 C) (Oral)   Resp 18   Ht 5\' 11"  (1.803 m)   Wt 180 lb (81.6 kg)   SpO2 99%   BMI 25.10 kg/m     Physical Exam  Constitutional: He is oriented to person, place, and time. He appears well-developed.  Appears in distress secondary to pain.  Pacing the room.  Cannot seem to find a comfortable position.  HENT:  Head: Normocephalic and atraumatic.  Eyes: Conjunctivae are normal. No scleral icterus.  Cardiovascular: Normal rate and regular rhythm.  No murmur  heard. Pulmonary/Chest: Effort normal and breath sounds normal. He has no wheezes. He has no rales.  Abdominal: Soft. He exhibits no distension. There is no tenderness. There is no rebound and no guarding.  Musculoskeletal:  No CVA tenderness.  Neurological: He is alert and oriented to person, place, and time.  Skin: Skin is warm. No rash noted.  Psychiatric: He has a normal mood and affect. His behavior is normal.  Vitals reviewed.  UC Treatments / Results  Labs (all labs ordered are listed, but only abnormal results are displayed) Labs Reviewed  URINALYSIS, COMPLETE (UACMP) WITH MICROSCOPIC - Abnormal; Notable for the following components:      Result Value   Hgb urine dipstick TRACE (*)    All other components within normal limits    EKG  EKG Interpretation None       Radiology Dg Abdomen 1 View  Result Date: 01/12/2017 CLINICAL DATA:  Right-sided back pain, initial encounter EXAM: ABDOMEN - 1 VIEW  COMPARISON:  None. FINDINGS: Scattered large and small bowel gas is noted. No abnormal mass or abnormal calcifications are noted. No acute bony abnormality is seen. IMPRESSION: No acute abnormality noted. Electronically Signed   By: Alcide CleverMark  Lukens M.D.   On: 01/12/2017 16:18    Procedures Procedures (including critical care time)  Medications Ordered in UC Medications  promethazine (PHENERGAN) injection 25 mg (25 mg Intramuscular Given 01/12/17 1548)  ketorolac (TORADOL) injection 60 mg (60 mg Intramuscular Given 01/12/17 1549)     Initial Impression / Assessment and Plan / UC Course  I have reviewed the triage vital signs and the nursing notes.  Pertinent labs & imaging results that were available during my care of the patient were reviewed by me and considered in my medical decision making (see chart for details).    31 year old male with a clinical picture consistent with nephrolithiasis.  Stone was not seen on KUB.  Patient had improvement in pain and nausea with Phenergan and Toradol.  Discharging home with Flomax and Vicodin. [place] controlled substance registry reviewed.  Patient has not had an opiate prescription since 2016.   Final Clinical Impressions(s) / UC Diagnoses   Final diagnoses:  Kidney stone    ED Discharge Orders        Ordered    HYDROcodone-acetaminophen (NORCO/VICODIN) 5-325 MG tablet  Every 8 hours PRN     01/12/17 1632    tamsulosin (FLOMAX) 0.4 MG CAPS capsule  Daily     01/12/17 1632     Controlled Substance Prescriptions [place] Controlled Substance Registry consulted? Yes, I have consulted the [place] Controlled Substances Registry for this patient, and feel the risk/benefit ratio today is favorable for proceeding with this prescription for a controlled substance.   Tommie SamsCook, Maxmilian Trostel G, [place] 01/12/17 [phone]

## 2017-01-12 NOTE — ED Triage Notes (Signed)
Patient complains of right sided back pain. Patient states that he has had a kidney stone previous and feels like this is similar. Patient states that he has the constant feeling of needing to urinate. Patient states that symptoms started suddenly at 8am this morning. Patient reports that pain radiates to abdomen.

## 2017-07-12 IMAGING — CR DG CHEST 2V
2 series · 3 of 3 positions shown · non-contrast
Comparison: None.

CLINICAL DATA: Chronic cough

EXAM:
CHEST  2 VIEW

[Series 1: chest pa · 0.14mm/px · 2 of 2 slices shown]
[im 1/2]
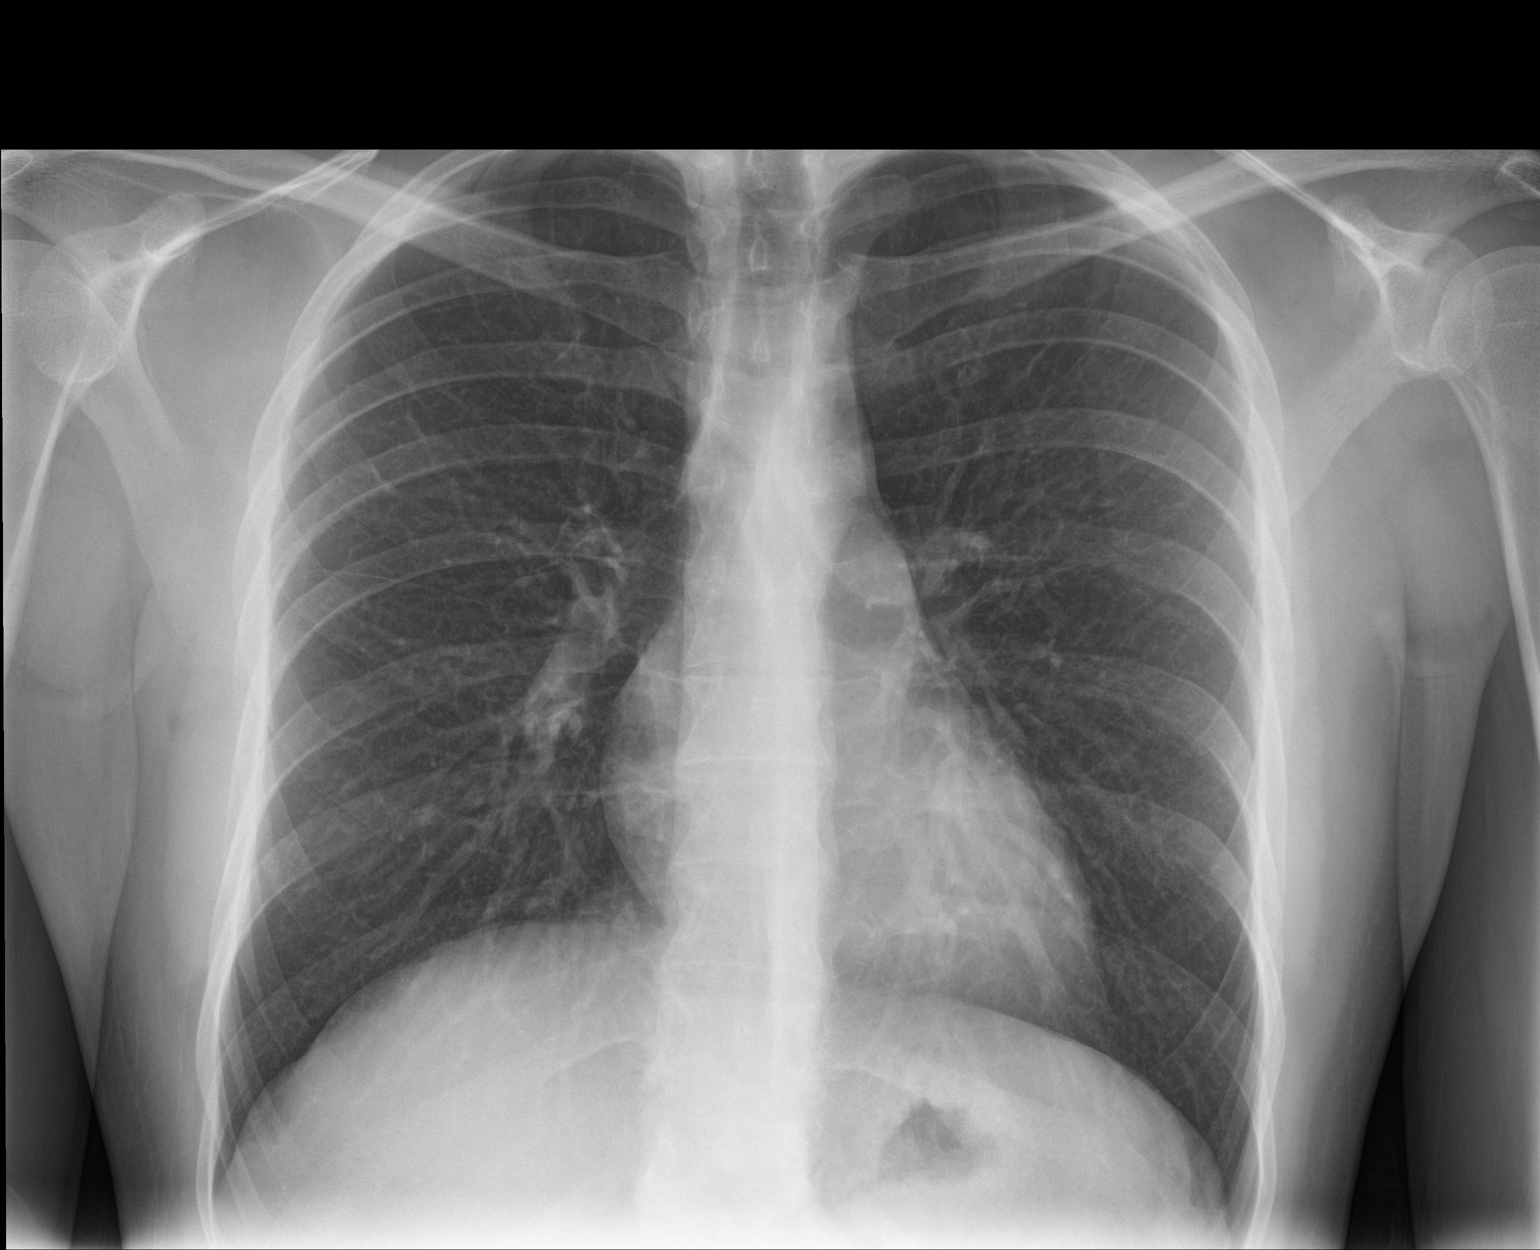
[im 2/2]
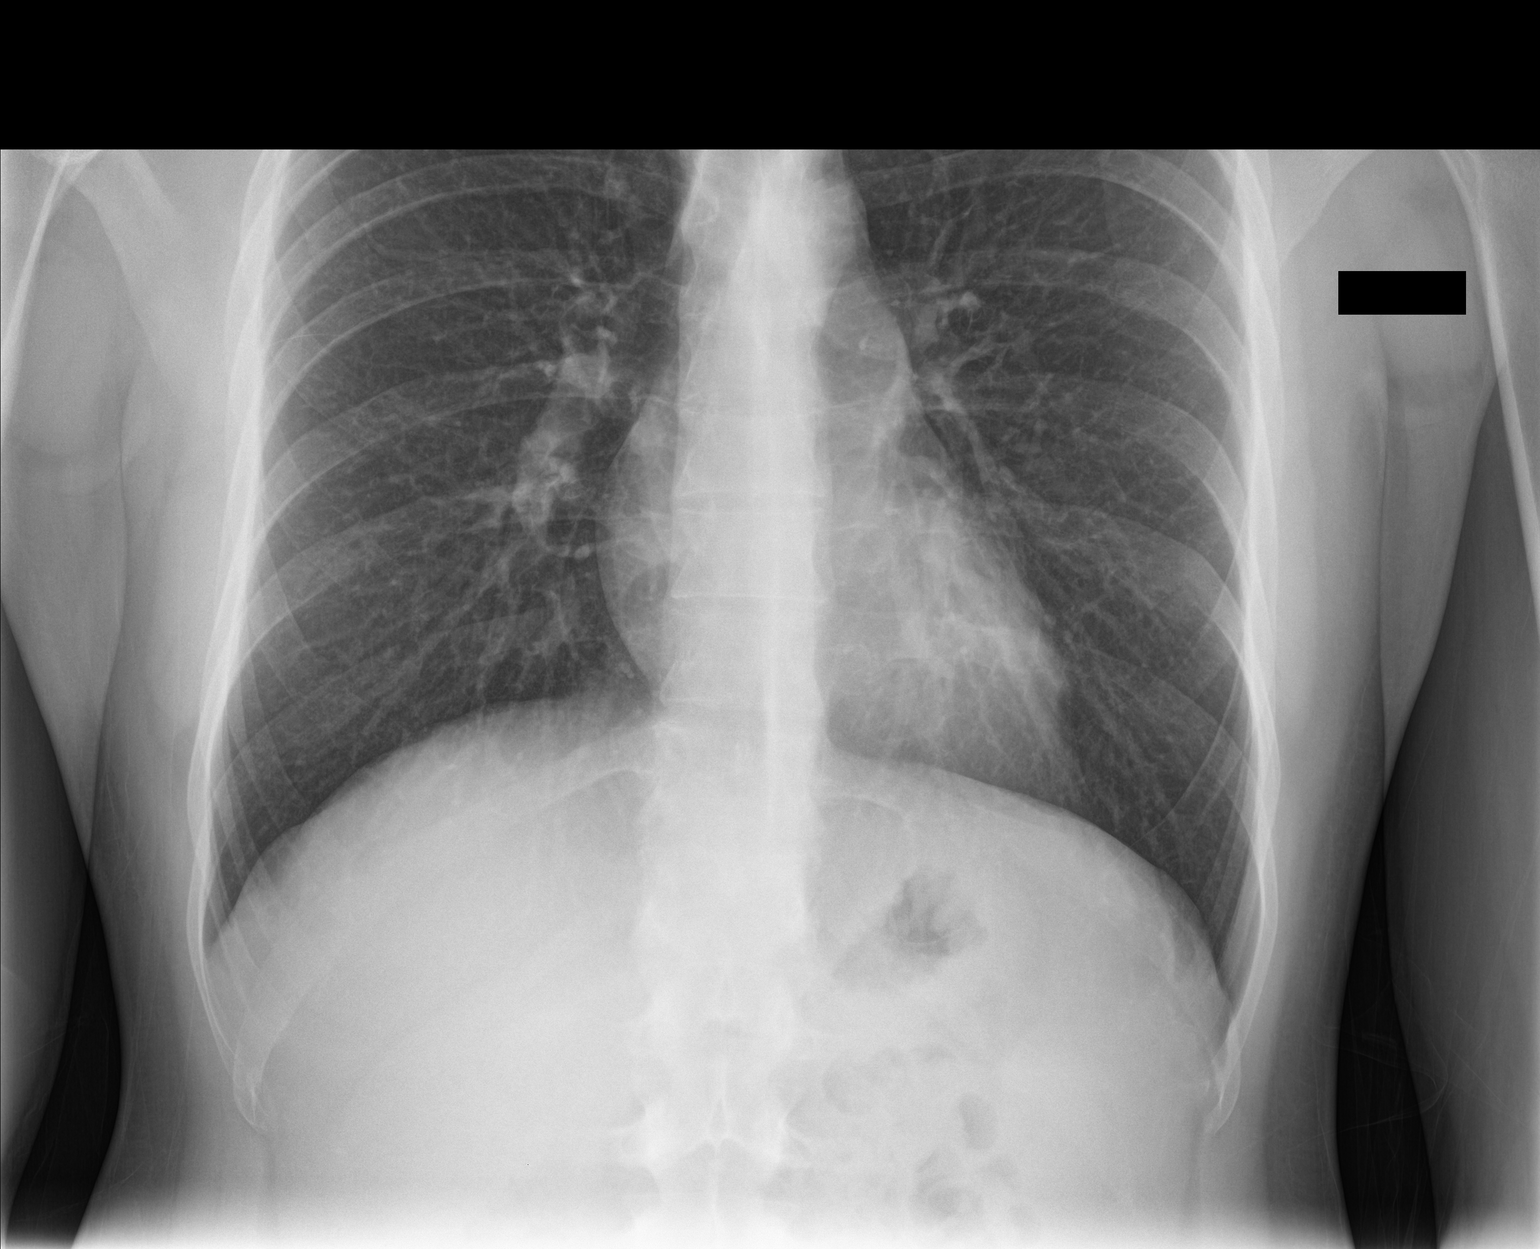

[chest lat]
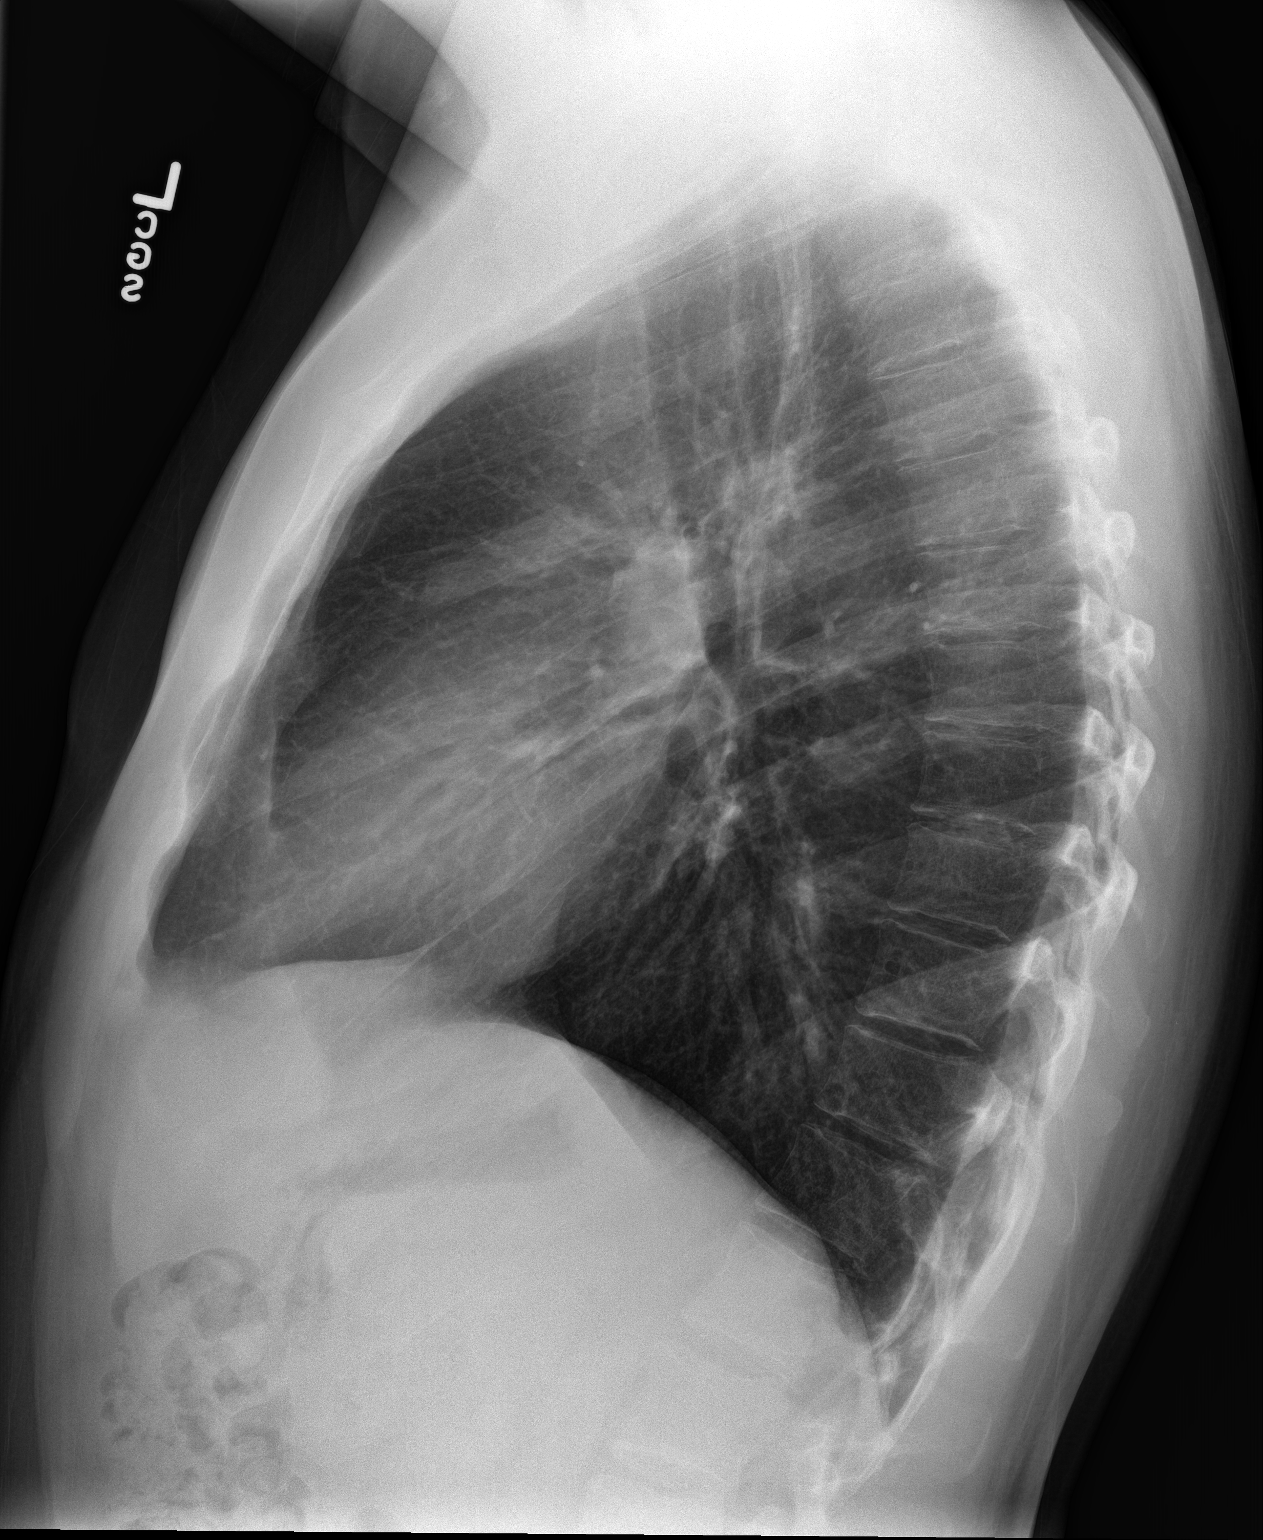

[3 of 3 positions shown; findings below may reference images not displayed]

FINDINGS: The heart size and mediastinal contours are within normal limits.
Both lungs are clear. The visualized skeletal structures are
unremarkable.
IMPRESSION: No active cardiopulmonary disease.

## 2017-08-15 ENCOUNTER — Other Ambulatory Visit: Payer: Self-pay | Admitting: Neurosurgery

## 2017-08-15 DIAGNOSIS — R7989 Other specified abnormal findings of blood chemistry: Secondary | ICD-10-CM

## 2017-08-15 DIAGNOSIS — R945 Abnormal results of liver function studies: Principal | ICD-10-CM

## 2017-08-22 ENCOUNTER — Ambulatory Visit: Payer: Commercial Managed Care - PPO

## 2017-09-05 ENCOUNTER — Ambulatory Visit
Admission: RE | Admit: 2017-09-05 | Discharge: 2017-09-05 | Disposition: A | Discharge status: Home / Self Care | Payer: Commercial Managed Care - PPO | Source: Ambulatory Visit | Attending: Neurosurgery | Admitting: Neurosurgery

## 2017-09-05 DIAGNOSIS — R945 Abnormal results of liver function studies: Secondary | ICD-10-CM

## 2017-09-05 DIAGNOSIS — N200 Calculus of kidney: Secondary | ICD-10-CM | POA: Insufficient documentation

## 2017-09-05 DIAGNOSIS — R7989 Other specified abnormal findings of blood chemistry: Secondary | ICD-10-CM | POA: Insufficient documentation

## 2017-09-05 DIAGNOSIS — D734 Cyst of spleen: Secondary | ICD-10-CM | POA: Diagnosis not present

## 2017-09-26 ENCOUNTER — Other Ambulatory Visit: Payer: Self-pay

## 2017-09-26 ENCOUNTER — Ambulatory Visit
Admission: EM | Admit: 2017-09-26 | Discharge: 2017-09-26 | Disposition: A | Discharge status: Home / Self Care | Payer: Commercial Managed Care - PPO | Attending: Family Medicine | Admitting: Family Medicine

## 2017-09-26 DIAGNOSIS — R197 Diarrhea, unspecified: Secondary | ICD-10-CM

## 2017-09-26 DIAGNOSIS — E876 Hypokalemia: Secondary | ICD-10-CM

## 2017-09-26 DIAGNOSIS — R112 Nausea with vomiting, unspecified: Secondary | ICD-10-CM

## 2017-09-26 LAB — CBC WITH DIFFERENTIAL/PLATELET
Basophils Absolute: 0 10*3/uL (ref 0–0.1)
Basophils Relative: 0 %
Eosinophils Absolute: 0 10*3/uL (ref 0–0.7)
Eosinophils Relative: 0 %
HCT: 48.6 % (ref 40.0–52.0)
Hemoglobin: 16.6 g/dL (ref 13.0–18.0)
Lymphocytes Relative: 9 %
Lymphs Abs: 0.5 10*3/uL — ABNORMAL LOW (ref 1.0–3.6)
MCH: 30.1 pg (ref 26.0–34.0)
MCHC: 34.1 g/dL (ref 32.0–36.0)
MCV: 88.2 fL (ref 80.0–100.0)
MONO ABS: 0.3 10*3/uL (ref 0.2–1.0)
MONOS PCT: 5 %
NEUTROS ABS: 5.4 10*3/uL (ref 1.4–6.5)
NEUTROS PCT: 86 %
Platelets: 183 10*3/uL (ref 150–440)
RBC: 5.51 MIL/uL (ref 4.40–5.90)
RDW: 13 % (ref 11.5–14.5)
WBC: 6.3 10*3/uL (ref 3.8–10.6)

## 2017-09-26 LAB — COMPREHENSIVE METABOLIC PANEL
ALT: 61 U/L — AB (ref 0–44)
ANION GAP: 9 (ref 5–15)
AST: 39 U/L (ref 15–41)
Albumin: 4.6 g/dL (ref 3.5–5.0)
Alkaline Phosphatase: 80 U/L (ref 38–126)
BUN: 12 mg/dL (ref 6–20)
CHLORIDE: 102 mmol/L (ref 98–111)
CO2: 24 mmol/L (ref 22–32)
CREATININE: 1.09 mg/dL (ref 0.61–1.24)
Calcium: 8.8 mg/dL — ABNORMAL LOW (ref 8.9–10.3)
Glucose, Bld: 161 mg/dL — ABNORMAL HIGH (ref 70–99)
POTASSIUM: 3.2 mmol/L — AB (ref 3.5–5.1)
Sodium: 135 mmol/L (ref 135–145)
Total Bilirubin: 0.8 mg/dL (ref 0.3–1.2)
Total Protein: 7.8 g/dL (ref 6.5–8.1)

## 2017-09-26 MED ORDER — POTASSIUM CHLORIDE CRYS ER 20 MEQ PO TBCR: 40.0000 meq | EXTENDED_RELEASE_TABLET | Freq: Two times a day (BID) | ORAL | 0 refills | Status: AC

## 2017-09-26 MED ORDER — DIPHENOXYLATE-ATROPINE 2.5-0.025 MG PO TABS: 1.0000 | ORAL_TABLET | Freq: Four times a day (QID) | ORAL | 0 refills | Status: AC | PRN

## 2017-09-26 NOTE — Discharge Instructions (Signed)
Rest, fluids. ° °Medication as prescribed. ° °Take care ° °Dr. Anyela Napierkowski  °

## 2017-09-26 NOTE — ED Triage Notes (Signed)
Patient complains of diarrhea that started on Sunday evening. Patient states that he took some anti-diarrheal medication and had some vomiting. Patient states that he had abdominal pain this morning but has not had the urge to defecate today. Patient states that currently he feels better but did call out of work last night.

## 2017-09-26 NOTE — ED Provider Notes (Signed)
[facility]    CSN: [id number] Arrival date & time: 09/26/17  [id number]  History   Chief Complaint Chief Complaint  Patient presents with  . Diarrhea   HPI  32 year old male presents with nausea, vomiting, diarrhea.  Started Sunday night.  Nausea and vomiting has now subsided.  However, he is still bothered by diarrhea.  He is been able to eat but has a decrease in appetite.  Continues to have watery diarrhea.  Little to no form.  He has had some abdominal pain but none currently.  He states that he feels improved [facility].  No fever.  No chills.  No reported sick contacts.  He has taken some Imodium without improvement.  No known exacerbating factors.  No other complaints.  PMH, Surgical Hx, Family Hx, Social History reviewed and updated as below.  PMH: Hx of Nephrolithiasis, elevated LFT's, GERD  Past Surgical History:  Procedure Laterality Date  . NO PAST SURGERIES     Home Medications    Prior to Admission medications   Medication Sig Start Date End Date Taking? Authorizing Provider  esomeprazole (NEXIUM) 40 MG capsule Take 40 mg by mouth daily at 12 noon.   Yes [provider]  diphenoxylate-atropine (LOMOTIL) 2.5-0.025 MG tablet Take 1 tablet by mouth 4 (four) times daily as needed for diarrhea or loose stools. 09/26/17   Tommie Samsook, Aloysius Heinle G, DO  potassium chloride SA (K-DUR,KLOR-CON) 20 MEQ tablet Take 2 tablets (40 mEq total) by mouth 2 (two) times daily. 09/26/17   Tommie Samsook, Keaden Gunnoe G, DO    Family History Family History  Problem Relation Age of Onset  . Diabetes Father   . Hypertension Father     Social History Social History   Tobacco Use  . Smoking status: Never Smoker  . Smokeless tobacco: Never Used  Substance Use Topics  . Alcohol use: Yes    Comment: occasional drinking, nothing in over a month  . Drug use: No     Allergies   Amoxicillin   Review of Systems Review of Systems  Constitutional: Positive  for appetite change.  Gastrointestinal: Positive for abdominal pain, diarrhea, nausea and vomiting.   Physical Exam Triage Vital Signs ED Triage Vitals  Enc Vitals Group     BP 09/26/17 0825 (!) 133/91     Pulse Rate 09/26/17 0825 73     Resp 09/26/17 0825 18     Temp 09/26/17 0825 98 F (36.7 C)     Temp Source 09/26/17 0825 Oral     SpO2 09/26/17 0825 100 %     Weight 09/26/17 0823 174 lb (78.9 kg)     Height 09/26/17 0823 5\' 8"  (1.727 m)     Head Circumference --      Peak Flow --      Pain Score 09/26/17 0823 0     Pain Loc --      Pain Edu? --      Excl. in GC? --    Updated Vital Signs BP (!) 133/91 (BP Location: Left Arm)   Pulse 73   Temp 98 F (36.7 C) (Oral)   Resp 18   Ht 5\' 8"  (1.727 m)   Wt 78.9 kg   SpO2 100%   BMI 26.46 kg/m   Visual Acuity Right Eye Distance:   Left Eye Distance:   Bilateral Distance:    Right Eye Near:   Left Eye Near:    Bilateral Near:  Physical Exam  Constitutional: He is oriented to person, place, and time. He appears well-developed. No distress.  Cardiovascular: Normal rate and regular rhythm.  Pulmonary/Chest: Effort normal and breath sounds normal. He has no wheezes. He has no rales.  Abdominal: Soft. He exhibits no distension. There is no tenderness.  Neurological: He is alert and oriented to person, place, and time.  Psychiatric: He has a normal mood and affect. His behavior is normal.  Nursing note and vitals reviewed.  UC Treatments / Results  Labs (all labs ordered are listed, but only abnormal results are displayed) Labs Reviewed  CBC WITH DIFFERENTIAL/PLATELET - Abnormal; Notable for the following components:      Result Value   Lymphs Abs 0.5 (*)    All other components within normal limits  COMPREHENSIVE METABOLIC PANEL - Abnormal; Notable for the following components:   Potassium 3.2 (*)    Glucose, Bld 161 (*)    Calcium 8.8 (*)    ALT 61 (*)    All other components within normal limits     EKG None  Radiology No results found.  Procedures Procedures (including critical care time)  Medications Ordered in UC Medications - No data to display  Initial Impression / Assessment and Plan / UC Course  I have reviewed the triage vital signs and the nursing notes.  Pertinent labs & imaging results that were available during my care of the patient were reviewed by me and considered in my medical decision making (see chart for details).    32 year old male presents with likely viral gastroenteritis.  Nausea and vomiting has now improved.  Laboratory studies revealed mild hypokalemia.  Will place on potassium briefly.  Lomotil for diarrhea.  Push fluids.  Supportive care.  Final Clinical Impressions(s) / UC Diagnoses   Final diagnoses:  Nausea vomiting and diarrhea  Hypokalemia     Discharge Instructions     Rest, fluids.  Medication as prescribed  Take care  Dr. Adriana Simas     ED Prescriptions    Medication Sig Dispense Auth. Provider   potassium chloride SA (K-DUR,KLOR-CON) 20 MEQ tablet Take 2 tablets (40 mEq total) by mouth 2 (two) times daily. 8 tablet Catalaya Garr G, DO   diphenoxylate-atropine (LOMOTIL) 2.5-0.025 MG tablet Take 1 tablet by mouth 4 (four) times daily as needed for diarrhea or loose stools. 30 tablet Tommie Sams, DO     Controlled Substance Prescriptions [employer] Controlled Substance Registry consulted? Not Applicable   Tommie Sams, [place] 09/26/17 [phone]

## 2018-04-28 ENCOUNTER — Encounter: Payer: Self-pay | Admitting: Emergency Medicine

## 2018-04-28 ENCOUNTER — Emergency Department: Payer: Commercial Managed Care - PPO

## 2018-04-28 ENCOUNTER — Other Ambulatory Visit: Payer: Self-pay

## 2018-04-28 ENCOUNTER — Emergency Department
Admission: EM | Admit: 2018-04-28 | Discharge: 2018-04-28 | Disposition: A | Discharge status: Home / Self Care | Payer: Commercial Managed Care - PPO | Attending: Emergency Medicine | Admitting: Emergency Medicine

## 2018-04-28 DIAGNOSIS — Y999 Unspecified external cause status: Secondary | ICD-10-CM | POA: Insufficient documentation

## 2018-04-28 DIAGNOSIS — W19XXXA Unspecified fall, initial encounter: Secondary | ICD-10-CM

## 2018-04-28 DIAGNOSIS — R55 Syncope and collapse: Secondary | ICD-10-CM | POA: Diagnosis not present

## 2018-04-28 DIAGNOSIS — M542 Cervicalgia: Secondary | ICD-10-CM | POA: Diagnosis present

## 2018-04-28 DIAGNOSIS — Y939 Activity, unspecified: Secondary | ICD-10-CM | POA: Insufficient documentation

## 2018-04-28 DIAGNOSIS — Y929 Unspecified place or not applicable: Secondary | ICD-10-CM | POA: Diagnosis not present

## 2018-04-28 DIAGNOSIS — W11XXXA Fall on and from ladder, initial encounter: Secondary | ICD-10-CM | POA: Diagnosis not present

## 2018-04-28 NOTE — Discharge Instructions (Addendum)

## 2018-04-28 NOTE — ED Triage Notes (Signed)
Pt placed in c-collar. Pt brought in by ems. States he was coming down the ladder and fell backwards and landed on the deck. C/o neck pain

## 2018-04-28 NOTE — ED Notes (Signed)
Pt out of c-collar and given water. xr called and they will come and get him for xr now that he is out of c-collar.

## 2018-04-28 NOTE — ED Provider Notes (Signed)
[facility] Emergency Department Provider Note  ____________________________________________  Time seen: Approximately 4:59 PM  I have reviewed the triage vital signs and the nursing notes.   HISTORY  Chief Complaint Fall   HPI Dustin Love is a 33 y.o. male with no significant past medical history who presents for evaluation of mechanical fall.  Patient reports falling from a 6 foot ladder onto his back on a wooden deck.  Had a brief episode of LOC.  Not on blood thinners.  He is complaining of a mild occipital headache which has been constant since the fall.  He denies neck pain, back pain, extremity pain, chest pain, abdominal pain or shortness of breath.  PMH GERD  Past Surgical History:  Procedure Laterality Date   NO PAST SURGERIES      Prior to Admission medications   Medication Sig Start Date End Date Taking? Authorizing Provider  diphenoxylate-atropine (LOMOTIL) 2.5-0.025 MG tablet Take 1 tablet by mouth 4 (four) times daily as needed for diarrhea or loose stools. 09/26/17   Tommie Sams, DO  esomeprazole (NEXIUM) 40 MG capsule Take 40 mg by mouth daily at 12 noon.    [provider]  potassium chloride SA (K-DUR,KLOR-CON) 20 MEQ tablet Take 2 tablets (40 mEq total) by mouth 2 (two) times daily. 09/26/17   Tommie Sams, DO    Allergies Amoxicillin  Family History  Problem Relation Age of Onset   Diabetes Father    Hypertension Father     Social History Social History   Tobacco Use   Smoking status: Never Smoker   Smokeless tobacco: Never Used  Substance Use Topics   Alcohol use: Yes    Comment: occasional drinking, nothing in over a month   Drug use: No    Review of Systems Constitutional: Negative for fever. Eyes: Negative for visual changes. ENT: Negative for facial injury or neck injury Cardiovascular: Negative for chest injury. Respiratory: Negative for shortness of breath. Negative for chest wall  injury. Gastrointestinal: Negative for abdominal pain or injury. Genitourinary: Negative for dysuria. Musculoskeletal: Negative for back injury, negative for arm or leg pain. Skin: Negative for laceration/abrasions. Neurological: + head injury.  ____________________________________________   PHYSICAL EXAM:  VITAL SIGNS: ED Triage Vitals  Enc Vitals Group     BP --      Pulse --      Resp --      Temp 04/28/18 1656 98.4 F (36.9 C)     Temp Source 04/28/18 1656 Oral     SpO2 --      Weight --      Height --      Head Circumference --      Peak Flow --      Pain Score 04/28/18 1658 3     Pain Loc --      Pain Edu? --      Excl. in GC? --    Full spinal precautions maintained throughout the trauma exam. Constitutional: Alert and oriented. No acute distress. Does not appear intoxicated. HEENT Head: Normocephalic and atraumatic. Face: No facial bony tenderness. Stable midface Ears: No hemotympanum bilaterally. No Battle sign Eyes: No eye injury. PERRL. No raccoon eyes Nose: Nontender. No epistaxis. No rhinorrhea Mouth/Throat: Mucous membranes are moist. No oropharyngeal blood. No dental injury. Airway patent without stridor. Normal voice.  Dry blood on lips with no laceration Neck: C-collar placed. No midline c-spine tenderness.  Bilateral paraspinal tenderness Cardiovascular: Normal rate, regular rhythm.  Normal and symmetric distal pulses are present in all extremities. Pulmonary/Chest: Chest wall is stable and nontender to palpation/compression. Normal respiratory effort. Breath sounds are normal. No crepitus.  Abdominal: Soft, nontender, non distended. Musculoskeletal: Nontender with normal full range of motion in all extremities. No deformities. No thoracic or lumbar midline spinal tenderness. Pelvis is stable. Skin: Skin is warm, dry and intact. No abrasions or contutions. Psychiatric: Speech and behavior are appropriate. Neurological: Normal speech and language. Moves  all extremities to command. No gross focal neurologic deficits are appreciated.  Glascow Coma Score: 4 - Opens eyes on own 6 - Follows simple motor commands 5 - Alert and oriented GCS: 15   ____________________________________________   LABS (all labs ordered are listed, but only abnormal results are displayed)  Labs Reviewed - No data to display ____________________________________________  EKG  none  ____________________________________________  RADIOLOGY  I have personally reviewed the images performed during this visit and I agree with the Radiologist's read.   Interpretation by Radiologist:  Dg Chest 2 View  Result Date: 04/28/2018 CLINICAL DATA:  Pain after falling off of a roof today. EXAM: CHEST - 2 VIEW COMPARISON:  05/31/2016 FINDINGS: The cardiomediastinal silhouette is within normal limits. The lungs are well inflated and clear. There is no evidence of pleural effusion or pneumothorax. No acute osseous abnormality is identified. IMPRESSION: No active cardiopulmonary disease. Electronically Signed   By: Sebastian Ache M.D.   On: 04/28/2018 19:23   Ct Head Wo Contrast  Result Date: 04/28/2018 CLINICAL DATA:  Neck pain after fall. EXAM: CT HEAD WITHOUT CONTRAST CT CERVICAL SPINE WITHOUT CONTRAST TECHNIQUE: Multidetector CT imaging of the head and cervical spine was performed following the standard protocol without intravenous contrast. Multiplanar CT image reconstructions of the cervical spine were also generated. COMPARISON:  None. FINDINGS: CT HEAD FINDINGS Brain: No evidence of acute infarction, hemorrhage, hydrocephalus, extra-axial collection or mass lesion/mass effect. Vascular: No hyperdense vessel or unexpected calcification. Skull: Normal. Negative for fracture or focal lesion. Sinuses/Orbits: No acute finding. Other: None. CT CERVICAL SPINE FINDINGS Alignment: Normal. Skull base and vertebrae: No acute fracture. No primary bone lesion or focal pathologic process.  Soft tissues and spinal canal: No prevertebral fluid or swelling. No visible canal hematoma. Disc levels:  No significant degenerative changes. Upper chest: Negative. Other: No other abnormalities. IMPRESSION: 1. No acute intracranial abnormalities. 2. No fracture or traumatic malalignment in the cervical spine. Electronically Signed   By: Gerome Sam Love M.D   On: 04/28/2018 18:42   Ct Cervical Spine Wo Contrast  Result Date: 04/28/2018 CLINICAL DATA:  Neck pain after fall. EXAM: CT HEAD WITHOUT CONTRAST CT CERVICAL SPINE WITHOUT CONTRAST TECHNIQUE: Multidetector CT imaging of the head and cervical spine was performed following the standard protocol without intravenous contrast. Multiplanar CT image reconstructions of the cervical spine were also generated. COMPARISON:  None. FINDINGS: CT HEAD FINDINGS Brain: No evidence of acute infarction, hemorrhage, hydrocephalus, extra-axial collection or mass lesion/mass effect. Vascular: No hyperdense vessel or unexpected calcification. Skull: Normal. Negative for fracture or focal lesion. Sinuses/Orbits: No acute finding. Other: None. CT CERVICAL SPINE FINDINGS Alignment: Normal. Skull base and vertebrae: No acute fracture. No primary bone lesion or focal pathologic process. Soft tissues and spinal canal: No prevertebral fluid or swelling. No visible canal hematoma. Disc levels:  No significant degenerative changes. Upper chest: Negative. Other: No other abnormalities. IMPRESSION: 1. No acute intracranial abnormalities. 2. No fracture or traumatic malalignment in the cervical spine. Electronically Signed   By: Onalee Hua  Mayford Knife Love M.D   On: 04/28/2018 18:42      ____________________________________________   PROCEDURES  Procedure(s) performed: None Procedures Critical Care performed:  None ____________________________________________   INITIAL IMPRESSION / ASSESSMENT AND PLAN / ED COURSE  33 y.o. male with no significant past medical history who  presents for evaluation of mechanical fall from a 6 foot ladder with occipital head trauma.  No signs or symptoms of basilar skull fracture, no lacerations, no midline CT and L-spine tenderness, no extremity injuries, bilateral breath sounds with no tenderness palpation of the chest wall.  C-collar was placed.  We will send patient for CT of the head and cervical spine.  We will do a chest x-ray.    _________________________ 7:30 PM on 04/28/2018 -----------------------------------------  Imaging with no acute injuries.  Patient remains well-appearing with minimal pain.  At this time will discharge home with follow-up with primary care doctor.  Discussed and return precautions.   As part of my medical decision making, I reviewed the following data within the electronic MEDICAL RECORD NUMBER Nursing notes reviewed and incorporated, Old chart reviewed, Radiograph reviewed , Notes from prior ED visits and [place] Controlled Substance Database    Pertinent labs & imaging results that were available during my care of the patient were reviewed by me and considered in my medical decision making (see chart for details).    ____________________________________________   FINAL CLINICAL IMPRESSION(S) / ED DIAGNOSES  Final diagnoses:  Fall, initial encounter      NEW MEDICATIONS STARTED DURING THIS VISIT:  ED Discharge Orders    None       Note:  This document was prepared using Dragon voice recognition software and may include unintentional dictation errors.    Don Perking, [place], MD 04/28/18 [phone]

## 2019-11-24 ENCOUNTER — Encounter: Payer: Self-pay | Admitting: Emergency Medicine

## 2019-11-24 ENCOUNTER — Emergency Department: Payer: Commercial Managed Care - PPO

## 2019-11-24 ENCOUNTER — Other Ambulatory Visit: Payer: Self-pay

## 2019-11-24 ENCOUNTER — Emergency Department
Admission: EM | Admit: 2019-11-24 | Discharge: 2019-11-24 | Disposition: A | Discharge status: Home / Self Care | Payer: Commercial Managed Care - PPO | Attending: Emergency Medicine | Admitting: Emergency Medicine

## 2019-11-24 DIAGNOSIS — W231XXA Caught, crushed, jammed, or pinched between stationary objects, initial encounter: Secondary | ICD-10-CM | POA: Insufficient documentation

## 2019-11-24 DIAGNOSIS — S61211A Laceration without foreign body of left index finger without damage to nail, initial encounter: Secondary | ICD-10-CM | POA: Diagnosis not present

## 2019-11-24 MED ORDER — SULFAMETHOXAZOLE-TRIMETHOPRIM 800-160 MG PO TABS: 1.0000 | ORAL_TABLET | Freq: Two times a day (BID) | ORAL | 0 refills | Status: DC

## 2019-11-24 NOTE — ED Notes (Signed)
Wound is bleeding freely with movement of the finger. Laceration noted around nailbed of 2nd finger and on anterior fingerpad.

## 2019-11-24 NOTE — ED Triage Notes (Signed)
Patient states that he smashed his left second finger between a bird bath and tree. Patient with laceration to finger.

## 2019-11-24 NOTE — ED Provider Notes (Signed)
[facility] Emergency Department Provider Note  ____________________________________________  Time seen: Approximately 10:13 PM  I have reviewed the triage vital signs and the nursing notes.   HISTORY  Chief Complaint Laceration    HPI Dustin Love is a 34 y.o. male that presents to the emergency department for evaluation of left index finger skin avulsion. Patient's finger got stuck between a bird bath and the tree. He tried to pull his finger out and pulled off some of the skin. Injury happened about 12-13 hours ago. Laceration has continued to bleed today so he came to the emergency department. No additional injuries. Last tetanus shot was about 1 year ago.  History reviewed. No pertinent past medical history.  There are no problems to display for this patient.   Past Surgical History:  Procedure Laterality Date   NO PAST SURGERIES      Prior to Admission medications   Medication Sig Start Date End Date Taking? Authorizing Provider  diphenoxylate-atropine (LOMOTIL) 2.5-0.025 MG tablet Take 1 tablet by mouth 4 (four) times daily as needed for diarrhea or loose stools. 09/26/17   Tommie Sams, DO  esomeprazole (NEXIUM) 40 MG capsule Take 40 mg by mouth daily at 12 noon.    [provider]  potassium chloride SA (K-DUR,KLOR-CON) 20 MEQ tablet Take 2 tablets (40 mEq total) by mouth 2 (two) times daily. 09/26/17   Tommie Sams, DO    Allergies Amoxicillin  Family History  Problem Relation Age of Onset   Diabetes Father    Hypertension Father     Social History Social History   Tobacco Use   Smoking status: Never Smoker   Smokeless tobacco: Never Used  Building services engineer Use: Never used  Substance Use Topics   Alcohol use: Not Currently   Drug use: No     Review of Systems  Constitutional: No fever/chills ENT: No upper respiratory complaints. Cardiovascular: No chest pain. Respiratory: No cough. No  SOB. Gastrointestinal: No abdominal pain.  No nausea, no vomiting.  Musculoskeletal: Positive for finger pain. Skin: Negative for rash, abrasions, lacerations, ecchymosis. Neurological: Negative for headaches, numbness or tingling   ____________________________________________   PHYSICAL EXAM:  VITAL SIGNS: ED Triage Vitals  Enc Vitals Group     BP 11/24/19 2143 (!) 146/96     Pulse Rate 11/24/19 2143 72     Resp 11/24/19 2143 16     Temp 11/24/19 2143 97.9 F (36.6 C)     Temp Source 11/24/19 2143 Oral     SpO2 11/24/19 2143 98 %     Weight 11/24/19 2145 205 lb (93 kg)     Height 11/24/19 2145 5\' 8"  (1.727 m)     Head Circumference --      Peak Flow --      Pain Score 11/24/19 2151 2     Pain Loc --      Pain Edu? --      Excl. in GC? --      Constitutional: Alert and oriented. Well appearing and in no acute distress. Eyes: Conjunctivae are normal. PERRL. EOMI. Head: Atraumatic. ENT:      Ears:      Nose: No congestion/rhinnorhea.      Mouth/Throat: Mucous membranes are moist.  Neck: No stridor. Cardiovascular: Normal rate, regular rhythm.  Good peripheral circulation. Respiratory: Normal respiratory effort without tachypnea or retractions. Lungs CTAB. Good air entry to the bases with no decreased or absent breath sounds.  Gastrointestinal: Bowel sounds 4 quadrants. Soft and nontender to palpation. No guarding or rigidity. No palpable masses. No distention.  Musculoskeletal: Full range of motion to all extremities. No gross deformities appreciated. Neurologic:  Normal speech and language. No gross focal neurologic deficits are appreciated.  Skin:  Skin is warm, dry. Small Skin avulsion to right index finger pad. Psychiatric: Mood and affect are normal. Speech and behavior are normal. Patient exhibits appropriate insight and judgement.   ____________________________________________   LABS (all labs ordered are listed, but only abnormal results are  displayed)  Labs Reviewed - No data to display ____________________________________________  EKG   ____________________________________________  RADIOLOGY Lexine Baton, personally viewed and evaluated these images (plain radiographs) as part of my medical decision making, as well as reviewing the written report by the radiologist.  No results found.  ____________________________________________    PROCEDURES  Procedure(s) performed:    Procedures  LACERATION REPAIR Performed by: Enid Derry  Consent: Verbal consent obtained.  Consent given by: patient  Prepped and Draped in normal sterile fashion  Wound explored: No foreign bodies   Laceration Location: finger  Laceration Length: 1 cm  Anesthesia: None  Local anesthetic: None  Irrigation method: syringe  Amount of cleaning: normal saline  Skin closure: Steri strip  Patient tolerance: Patient tolerated the procedure well with no immediate complications.  Medications - No data to display   ____________________________________________   INITIAL IMPRESSION / ASSESSMENT AND PLAN / ED COURSE  Pertinent labs & imaging results that were available during my care of the patient were reviewed by me and considered in my medical decision making (see chart for details).  Review of the [place] CSRS was performed in accordance of the [facility] prior to dispensing any controlled drugs.   Presents to the emergency department for evaluation after finger injury.  Vital signs and exam are reassuring.  Laceration is greater than 12 hours old so will be repaired with Steri-Strips at this time.  X-ray negative for acute bony abnormalities.  Injury was bandaged and splint was applied.  Patient will be discharged home with prescriptions for Keflex. Patient is to follow up with primary care as directed. Patient is given ED precautions to return to the ED for any worsening or new symptoms.  Dustin Love was evaluated  in Emergency Department on 11/24/2019 for the symptoms described in the history of present illness. He was evaluated in the context of the global COVID-19 pandemic, which necessitated consideration that the patient might be at risk for infection with the SARS-CoV-2 virus that causes COVID-19. Institutional protocols and algorithms that pertain to the evaluation of patients at risk for COVID-19 are in a state of rapid change based on information released by regulatory bodies including the CDC and federal and state organizations. These policies and algorithms were followed during the patient's care in the ED.   ____________________________________________  FINAL CLINICAL IMPRESSION(S) / ED DIAGNOSES  Final diagnoses:  None      NEW MEDICATIONS STARTED DURING THIS VISIT:  ED Discharge Orders    None          This chart was dictated using voice recognition software/Dragon. Despite best efforts to proofread, errors can occur which can change the meaning. Any change was purely unintentional.    Enid Derry, PA-C 11/24/19 2316    [place], MD 11/25/19 [phone]

## 2020-05-06 ENCOUNTER — Emergency Department
Admission: EM | Admit: 2020-05-06 | Discharge: 2020-05-06 | Disposition: A | Discharge status: Home / Self Care | Payer: Commercial Managed Care - PPO | Attending: Emergency Medicine | Admitting: Emergency Medicine

## 2020-05-06 ENCOUNTER — Other Ambulatory Visit: Payer: Self-pay

## 2020-05-06 DIAGNOSIS — H9202 Otalgia, left ear: Secondary | ICD-10-CM | POA: Insufficient documentation

## 2020-05-06 MED ORDER — CLARITIN-D 24 HOUR 10-240 MG PO TB24: 1.0000 | ORAL_TABLET | Freq: Every day | ORAL | 0 refills | Status: AC

## 2020-05-06 MED ORDER — FLUTICASONE PROPIONATE 50 MCG/ACT NA SUSP: 1.0000 | Freq: Every day | NASAL | 0 refills | Status: AC

## 2020-05-06 NOTE — Discharge Instructions (Addendum)
You may alternate Tylenol 1000 mg every 6 hours as needed for pain, fever and Ibuprofen 800 mg every 8 hours as needed for pain, fever.  Please take Ibuprofen with food.  Do not take more than 4000 mg of Tylenol (acetaminophen) in a 24 hour period.  You do not have an ear infection today.  You do not need to be on antibiotics.  If symptoms are not improving with prescriptions provided today in 1 to 2 weeks, please follow-up with your primary care doctor.  Steps to find a Primary Care Provider (PCP):  Call [phone] or [phone] to access "[place] Find a Doctor Service."  2.  You may also go on the [facility] website at [email or web address]

## 2020-05-06 NOTE — ED Triage Notes (Signed)
Pt reports L ear pain x3 weeks. Denies fever, cough, sore throat.

## 2020-05-06 NOTE — ED Notes (Signed)
Pt presents to ED with L ear pain x3 weeks, states he has not had time to get it checked out. Reports recently having sinus problems. Denies headache, runny nose, cough, fevers at this time. AAO NAD VSS

## 2020-05-06 NOTE — ED Provider Notes (Signed)
[facility] Emergency Department Provider Note  ____________________________________________   Event Date/Time   First MD Initiated Contact with Patient 05/06/20 [phone]     (approximate)  I have reviewed the triage vital signs and the nursing notes.   HISTORY  Chief Complaint Otalgia    HPI Dustin Love is a 35 y.o. male with no significant past medical history who presents to the emergency department with left-sided ear pain for 3 weeks.  States he has been putting it off but wanted to get it taken care of before his son was having surgery next week.  States he does not currently have a primary care doctor.  Denies fevers, cough, sore throat.  States several weeks ago he did have a sinus infection that has resolved.        History reviewed. No pertinent past medical history.  There are no problems to display for this patient.   Past Surgical History:  Procedure Laterality Date  . NO PAST SURGERIES      Prior to Admission medications   Medication Sig Start Date End Date Taking? Authorizing Provider  fluticasone (FLONASE) 50 MCG/ACT nasal spray Place 1 spray into both nostrils daily. 05/06/20 05/06/21 Yes Diogo Anne, Layla Maw, DO  loratadine-pseudoephedrine (CLARITIN-D 24 HOUR) 10-240 MG 24 hr tablet Take 1 tablet by mouth daily. 05/06/20 06/05/20 Yes Vincent Streater, Layla Maw, DO  diphenoxylate-atropine (LOMOTIL) 2.5-0.025 MG tablet Take 1 tablet by mouth 4 (four) times daily as needed for diarrhea or loose stools. 09/26/17   Tommie Sams, DO  esomeprazole (NEXIUM) 40 MG capsule Take 40 mg by mouth daily at 12 noon.    [provider]  potassium chloride SA (K-DUR,KLOR-CON) 20 MEQ tablet Take 2 tablets (40 mEq total) by mouth 2 (two) times daily. 09/26/17   Tommie Sams, DO  sulfamethoxazole-trimethoprim (BACTRIM DS) 800-160 MG tablet Take 1 tablet by mouth 2 (two) times daily. 11/24/19   Enid Derry, PA-C    Allergies Amoxicillin  Family History   Problem Relation Age of Onset  . Diabetes Father   . Hypertension Father     Social History Social History   Tobacco Use  . Smoking status: Never Smoker  . Smokeless tobacco: Never Used  Vaping Use  . Vaping Use: Never used  Substance Use Topics  . Alcohol use: Not Currently  . Drug use: No    Review of Systems Constitutional: No fever. Eyes: No visual changes. ENT: No sore throat. Cardiovascular: Denies chest pain. Respiratory: Denies shortness of breath. Gastrointestinal: No nausea, vomiting, diarrhea. Genitourinary: Negative for dysuria. Musculoskeletal: Negative for back pain. Skin: Negative for rash. Neurological: Negative for focal weakness or numbness.  ____________________________________________   PHYSICAL EXAM:  VITAL SIGNS: ED Triage Vitals  Enc Vitals Group     BP 05/06/20 0351 (!) 152/92     Pulse Rate 05/06/20 0351 86     Resp 05/06/20 0351 16     Temp 05/06/20 0351 98.1 F (36.7 C)     Temp src --      SpO2 05/06/20 0351 97 %     Weight 05/06/20 0352 200 lb (90.7 kg)     Height 05/06/20 0352 5\' 8"  (1.727 m)     Head Circumference --      Peak Flow --      Pain Score 05/06/20 0352 2     Pain Loc --      Pain Edu? --      Excl. in GC? --  CONSTITUTIONAL: Alert and oriented and responds appropriately to questions. Well-appearing; well-nourished HEAD: Normocephalic EYES: Conjunctivae clear, pupils appear equal, EOM appear intact ENT: normal nose; moist mucous membranes; TMs are clear bilaterally without erythema, purulence, bulging, perforation, effusion.  No cerumen impaction or sign of foreign body in the external auditory canal. No inflammation, erythema or drainage from the external auditory canal. No signs of mastoiditis. No pain with manipulation of the pinna bilaterally. NECK: Supple, normal ROM CARD: RRR; S1 and S2 appreciated; no murmurs, no clicks, no rubs, no gallops RESP: Normal chest excursion without splinting or tachypnea;  breath sounds clear and equal bilaterally; no wheezes, no rhonchi, no rales, no hypoxia or respiratory distress, speaking full sentences ABD/GI: Normal bowel sounds; non-distended; soft, non-tender, no rebound, no guarding, no peritoneal signs, no hepatosplenomegaly BACK: The back appears normal EXT: Normal ROM in all joints; no deformity noted, no edema; no cyanosis SKIN: Normal color for age and race; warm; no rash on exposed skin NEURO: Moves all extremities equally PSYCH: The patient's mood and manner are appropriate.  ____________________________________________   LABS (all labs ordered are listed, but only abnormal results are displayed)  Labs Reviewed - No data to display ____________________________________________  EKG   ____________________________________________  RADIOLOGY I, Jaylean Buenaventura, personally viewed and evaluated these images (plain radiographs) as part of my medical decision making, as well as reviewing the written report by the radiologist.  ED MD interpretation:  none  Official radiology report(s): No results found.  ____________________________________________   PROCEDURES  Procedure(s) performed (including Critical Care):  Procedures   ____________________________________________   INITIAL IMPRESSION / ASSESSMENT AND PLAN / ED COURSE  As part of my medical decision making, I reviewed the following data within the electronic MEDICAL RECORD NUMBER Nursing notes reviewed and incorporated, Old chart reviewed and Notes from prior ED visits         Patient here with left-sided otalgia.  No signs of otitis media, otitis externa, mastoiditis on exam.  He may have a very small effusion behind the left TM.  This could be related to his recent sinusitis.  I do not feel he needs antibiotics today.  Recommended alternating Tylenol, Motrin for pain.  We will start him on loratadine with a decongestant and Flonase.  Given outpatient follow-up if symptoms not  improving.  At this time, I do not feel there is any life-threatening condition present. I have reviewed, interpreted and discussed all results (EKG, imaging, lab, urine as appropriate) and exam findings with patient/family. I have reviewed nursing notes and appropriate previous records.  I feel the patient is safe to be discharged home without further emergent workup and can continue workup as an outpatient as needed. Discussed usual and customary return precautions. Patient/family verbalize understanding and are comfortable with this plan.  Outpatient follow-up has been provided as needed. All questions have been answered.  ____________________________________________   FINAL CLINICAL IMPRESSION(S) / ED DIAGNOSES  Final diagnoses:  Left ear pain     ED Discharge Orders         Ordered    loratadine-pseudoephedrine (CLARITIN-D 24 HOUR) 10-240 MG 24 hr tablet  Daily        05/06/20 0414    fluticasone (FLONASE) 50 MCG/ACT nasal spray  Daily        05/06/20 0414          *Please note:  Jolaine Artist Love was evaluated in Emergency Department on 05/06/2020 for the symptoms described in the history of present illness. He  was evaluated in the context of the global COVID-19 pandemic, which necessitated consideration that the patient might be at risk for infection with the SARS-CoV-2 virus that causes COVID-19. Institutional protocols and algorithms that pertain to the evaluation of patients at risk for COVID-19 are in a state of rapid change based on information released by regulatory bodies including the CDC and federal and state organizations. These policies and algorithms were followed during the patient's care in the ED.  Some ED evaluations and interventions may be delayed as a result of limited staffing during and the pandemic.*   Note:  This document was prepared using Dragon voice recognition software and may include unintentional dictation errors.   Kanin Lia, Layla Maw, DO 05/06/20  [phone]

## 2020-10-23 ENCOUNTER — Other Ambulatory Visit: Payer: Self-pay

## 2020-11-02 ENCOUNTER — Other Ambulatory Visit: Payer: Self-pay

## 2020-12-30 ENCOUNTER — Other Ambulatory Visit: Payer: Self-pay

## 2020-12-30 MED ORDER — ESOMEPRAZOLE MAGNESIUM 40 MG PO CPDR
DELAYED_RELEASE_CAPSULE | ORAL | 1 refills | Status: DC
  Filled 2020-12-30: qty 90, 90d supply, fill #0
  Filled 2021-04-01: qty 90, 90d supply, fill #1

## 2021-01-06 ENCOUNTER — Other Ambulatory Visit: Payer: Self-pay

## 2021-01-06 DIAGNOSIS — E349 Endocrine disorder, unspecified: Secondary | ICD-10-CM | POA: Diagnosis not present

## 2021-01-06 MED ORDER — "BD TB SYRINGE 21G X 1"" 1 ML MISC": 1 refills | Status: AC

## 2021-01-06 MED ORDER — TESTOSTERONE CYPIONATE 200 MG/ML IM SOLN
INTRAMUSCULAR | 4 refills | Status: DC
  Filled 2021-01-06: qty 5, 70d supply, fill #0
  Filled 2021-01-07: qty 4, 28d supply, fill #0
  Filled 2021-01-08: qty 5, 70d supply, fill #0
  Filled 2021-01-08: qty 2, 30d supply, fill #0
  Filled 2021-01-21 – 2021-02-05 (×3): qty 2, 30d supply, fill #1
  Filled 2021-03-08: qty 2, 30d supply, fill #2
  Filled 2021-04-05: qty 2, 30d supply, fill #3
  Filled 2021-05-04: qty 2, 30d supply, fill #4
  Filled 2021-06-16: qty 2, 30d supply, fill #5

## 2021-01-07 ENCOUNTER — Other Ambulatory Visit: Payer: Self-pay

## 2021-01-08 ENCOUNTER — Other Ambulatory Visit: Payer: Self-pay

## 2021-01-21 ENCOUNTER — Other Ambulatory Visit: Payer: Self-pay

## 2021-01-27 ENCOUNTER — Other Ambulatory Visit: Payer: Self-pay

## 2021-01-28 ENCOUNTER — Other Ambulatory Visit: Payer: Self-pay

## 2021-02-05 ENCOUNTER — Other Ambulatory Visit: Payer: Self-pay

## 2021-03-01 ENCOUNTER — Other Ambulatory Visit: Payer: Self-pay

## 2021-03-08 ENCOUNTER — Other Ambulatory Visit: Payer: Self-pay

## 2021-03-15 ENCOUNTER — Other Ambulatory Visit: Payer: Self-pay

## 2021-04-01 ENCOUNTER — Other Ambulatory Visit: Payer: Self-pay

## 2021-04-05 ENCOUNTER — Other Ambulatory Visit: Payer: Self-pay

## 2021-04-06 ENCOUNTER — Other Ambulatory Visit: Payer: Self-pay

## 2021-04-16 DIAGNOSIS — E349 Endocrine disorder, unspecified: Secondary | ICD-10-CM | POA: Diagnosis not present

## 2021-05-04 ENCOUNTER — Other Ambulatory Visit: Payer: Self-pay

## 2021-05-24 ENCOUNTER — Other Ambulatory Visit: Payer: Self-pay

## 2021-05-24 DIAGNOSIS — M222X1 Patellofemoral disorders, right knee: Secondary | ICD-10-CM | POA: Diagnosis not present

## 2021-05-24 MED ORDER — MELOXICAM 15 MG PO TABS
ORAL_TABLET | ORAL | 1 refills | Status: DC
  Filled 2021-05-24: qty 30, 30d supply, fill #0

## 2021-06-04 ENCOUNTER — Other Ambulatory Visit: Payer: Self-pay

## 2021-06-04 MED ORDER — ESOMEPRAZOLE MAGNESIUM 40 MG PO CPDR
DELAYED_RELEASE_CAPSULE | ORAL | 0 refills | Status: DC
  Filled 2021-06-04 – 2021-06-16 (×2): qty 90, 90d supply, fill #0

## 2021-06-16 ENCOUNTER — Other Ambulatory Visit: Payer: Self-pay

## 2021-07-16 ENCOUNTER — Other Ambulatory Visit: Payer: Self-pay

## 2021-07-16 MED ORDER — TESTOSTERONE CYPIONATE 200 MG/ML IM SOLN
INTRAMUSCULAR | 4 refills | Status: DC
  Filled 2021-07-16: qty 2, 28d supply, fill #0
  Filled 2021-08-16: qty 2, 28d supply, fill #1
  Filled 2021-11-02: qty 2, 28d supply, fill #2
  Filled 2022-01-04: qty 2, 28d supply, fill #3

## 2021-07-19 ENCOUNTER — Other Ambulatory Visit: Payer: Self-pay

## 2021-07-19 ENCOUNTER — Emergency Department
Admission: EM | Admit: 2021-07-19 | Discharge: 2021-07-19 | Disposition: A | Discharge status: Home / Self Care | Payer: 59 | Attending: Emergency Medicine | Admitting: Emergency Medicine

## 2021-07-19 DIAGNOSIS — Y9301 Activity, walking, marching and hiking: Secondary | ICD-10-CM | POA: Diagnosis not present

## 2021-07-19 DIAGNOSIS — S91311A Laceration without foreign body, right foot, initial encounter: Secondary | ICD-10-CM | POA: Diagnosis not present

## 2021-07-19 DIAGNOSIS — S21112A Laceration without foreign body of left front wall of thorax without penetration into thoracic cavity, initial encounter: Secondary | ICD-10-CM | POA: Insufficient documentation

## 2021-07-19 DIAGNOSIS — Y92009 Unspecified place in unspecified non-institutional (private) residence as the place of occurrence of the external cause: Secondary | ICD-10-CM | POA: Insufficient documentation

## 2021-07-19 DIAGNOSIS — S3991XA Unspecified injury of abdomen, initial encounter: Secondary | ICD-10-CM | POA: Diagnosis not present

## 2021-07-19 DIAGNOSIS — T07XXXA Unspecified multiple injuries, initial encounter: Secondary | ICD-10-CM

## 2021-07-19 DIAGNOSIS — S61412A Laceration without foreign body of left hand, initial encounter: Secondary | ICD-10-CM | POA: Insufficient documentation

## 2021-07-19 DIAGNOSIS — S81811A Laceration without foreign body, right lower leg, initial encounter: Secondary | ICD-10-CM | POA: Diagnosis not present

## 2021-07-19 DIAGNOSIS — S41012A Laceration without foreign body of left shoulder, initial encounter: Secondary | ICD-10-CM | POA: Insufficient documentation

## 2021-07-19 DIAGNOSIS — S81812A Laceration without foreign body, left lower leg, initial encounter: Secondary | ICD-10-CM | POA: Diagnosis not present

## 2021-07-19 DIAGNOSIS — W2209XA Striking against other stationary object, initial encounter: Secondary | ICD-10-CM | POA: Diagnosis not present

## 2021-07-19 DIAGNOSIS — S6992XA Unspecified injury of left wrist, hand and finger(s), initial encounter: Secondary | ICD-10-CM | POA: Diagnosis present

## 2021-07-19 MED ORDER — HYDROCODONE-ACETAMINOPHEN 5-325 MG PO TABS: 1.0000 | ORAL_TABLET | ORAL | 0 refills | Status: DC | PRN

## 2021-07-19 MED ORDER — SULFAMETHOXAZOLE-TRIMETHOPRIM 800-160 MG PO TABS: 1.0000 | ORAL_TABLET | Freq: Two times a day (BID) | ORAL | 0 refills | Status: DC

## 2021-07-19 MED ORDER — HYDROCODONE-ACETAMINOPHEN 5-325 MG PO TABS
1.0000 | ORAL_TABLET | Freq: Once | ORAL | Status: AC
  Administered 2021-07-19: 1 via ORAL
  Filled 2021-07-19: qty 1

## 2021-07-19 NOTE — ED Triage Notes (Signed)
Patient arrived by EMS from home. Patient fell through glass door and has lacerations. Not actively bleeding.

## 2021-07-19 NOTE — ED Provider Notes (Signed)
[facility] Provider Note  Patient Contact: 7:46 PM (approximate)   History   Laceration   HPI  Dustin Love is a 36 y.o. male who presents the emergency department for multiple lacerations.  Patient states that he and his wife were on vacation, when he returned home he had forgotten about the new glasses screen door.  He states that he was not paying attention, went through what he thought was an open door and ended up walking through the glass door.  Patient has lacerations throughout the torso, left shoulder, left hand, right leg and right foot.  Patient states the deepest of the lacerations are along the left rib cage and left shoulder.  States that he was bleeding heavily for most of his wounds initially, however he was able to control the bleeding with direct pressure to all the wounds.  He is up-to-date on tetanus immunization.  No other injury or complaint at this time.     Physical Exam   Triage Vital Signs: ED Triage Vitals  Enc Vitals Group     BP 07/19/21 1815 (!) 151/99     Pulse Rate 07/19/21 1815 100     Resp 07/19/21 1815 17     Temp 07/19/21 1815 98.6 F (37 C)     Temp Source 07/19/21 1815 Oral     SpO2 07/19/21 1815 93 %     Weight 07/19/21 1816 210 lb (95.3 kg)     Height 07/19/21 1816 5\' 10"  (1.778 m)     Head Circumference --      Peak Flow --      Pain Score 07/19/21 1815 5     Pain Loc --      Pain Edu? --      Excl. in GC? --     Most recent vital signs: Vitals:   07/19/21 1815  BP: (!) 151/99  Pulse: 100  Resp: 17  Temp: 98.6 F (37 C)  SpO2: 93%     General: Alert and in no acute distress.  Neck: No stridor. No cervical spine tenderness to palpation  Cardiovascular:  Good peripheral perfusion Respiratory: Normal respiratory effort without tachypnea or retractions. Lungs CTAB. Gastrointestinal: Bowel sounds 4 quadrants. Soft and nontender to palpation. No guarding or rigidity. No palpable masses. No  distention. No CVA tenderness. Musculoskeletal: Full range of motion to all extremities.  Neurologic:  No gross focal neurologic deficits are appreciated.  Skin:   Patient has numerous lacerations, abrasions and punctures noted.  Majority of injuries occurred along the left posterior shoulder extending into the left lateral chest and left abdominal wall.  Multiple of these lacerations have minuscule pieces of glass but there are no large shards of retained foreign body.  No active bleeding.  Patient has a superficial laceration to the left palm as well as a superficial laceration to the right foot.  Multiple small abrasions and lacerations noted to both the lower legs.  Again no active bleeding from any laceration. Other:   ED Results / Procedures / Treatments   Labs (all labs ordered are listed, but only abnormal results are displayed) Labs Reviewed - No data to display   EKG     RADIOLOGY    No results found.  PROCEDURES:  Critical Care performed:   Procedures   MEDICATIONS ORDERED IN ED: Medications  HYDROcodone-acetaminophen (NORCO/VICODIN) 5-325 MG per tablet 1 tablet (1 tablet Oral Given 07/19/21 1952)     IMPRESSION / MDM /  ASSESSMENT AND PLAN / ED COURSE  I reviewed the triage vital signs and the nursing notes.                              Differential diagnosis includes, but is not limited to, multiple lacerations, retained foreign body, puncture wounds  Patient's presentation is most consistent with acute illness / injury with system symptoms.   Patient's diagnosis is consistent with multiple lacerations.  Patient presents to the ED after accidentally walking through a glass door.  Patient states that he had just installed a glass door at his home, on vacation.  When he came back he forgot it was there solidly open doorway and walk through it.  Patient sustained multiple lacerations to the left shoulder, left thorax, left hand, bilateral legs.  Thankfully these  were all superficial in nature, and only needed cleaning and dressing which was performed in the ED.  These lacerations require closure with sutures, staples or other methods.  Patient is given wound care instructions.  Given the multiple 2 to of lacerations he will be placed on antibiotics prophylactically.  His tetanus shot was up-to-date and did not need updated tonight.  Patient will have prescription for antibiotic and pain medication called into the pharmacy for him..  Return precautions discussed with the patient.. Patient is given ED precautions to return to the ED for any worsening or new symptoms.        FINAL CLINICAL IMPRESSION(S) / ED DIAGNOSES   Final diagnoses:  Multiple lacerations     Rx / DC Orders   ED Discharge Orders          Ordered    sulfamethoxazole-trimethoprim (BACTRIM DS) 800-160 MG tablet  2 times daily        07/19/21 2031    HYDROcodone-acetaminophen (NORCO/VICODIN) 5-325 MG tablet  Every 4 hours PRN        07/19/21 2031             Note:  This document was prepared using Dragon voice recognition software and may include unintentional dictation errors.   Lanette Hampshire 07/19/21 2033    Minna Antis, MD 07/19/21 2258

## 2021-07-19 NOTE — ED Triage Notes (Signed)
Pt presents to ED with c/o of walking through the glass door. Pt has Lac to palm of L hand, L lateral side, bleeding controlled, EMS wrapped wounds.

## 2021-08-16 ENCOUNTER — Other Ambulatory Visit: Payer: Self-pay

## 2021-09-03 ENCOUNTER — Other Ambulatory Visit: Payer: Self-pay

## 2021-09-03 MED ORDER — ESOMEPRAZOLE MAGNESIUM 40 MG PO CPDR
DELAYED_RELEASE_CAPSULE | ORAL | 0 refills | Status: DC
  Filled 2021-09-03: qty 90, 90d supply, fill #0

## 2021-09-06 ENCOUNTER — Other Ambulatory Visit: Payer: Self-pay

## 2021-09-07 ENCOUNTER — Other Ambulatory Visit: Payer: Self-pay

## 2021-09-16 ENCOUNTER — Other Ambulatory Visit: Payer: Self-pay

## 2021-09-16 DIAGNOSIS — Z1331 Encounter for screening for depression: Secondary | ICD-10-CM | POA: Diagnosis not present

## 2021-09-16 DIAGNOSIS — Z0000 Encounter for general adult medical examination without abnormal findings: Secondary | ICD-10-CM | POA: Diagnosis not present

## 2021-09-16 DIAGNOSIS — K219 Gastro-esophageal reflux disease without esophagitis: Secondary | ICD-10-CM | POA: Diagnosis not present

## 2021-09-16 DIAGNOSIS — Z136 Encounter for screening for cardiovascular disorders: Secondary | ICD-10-CM | POA: Diagnosis not present

## 2021-09-16 DIAGNOSIS — Z131 Encounter for screening for diabetes mellitus: Secondary | ICD-10-CM | POA: Diagnosis not present

## 2021-09-16 MED ORDER — ESOMEPRAZOLE MAGNESIUM 40 MG PO CPDR
DELAYED_RELEASE_CAPSULE | ORAL | 3 refills | Status: DC
  Filled 2021-09-16: qty 90, 90d supply, fill #0
  Filled 2021-11-19: qty 30, 30d supply, fill #0
  Filled 2021-12-14 – 2021-12-15 (×2): qty 30, 30d supply, fill #1
  Filled 2022-01-04: qty 30, 30d supply, fill #2
  Filled 2022-01-26 – 2022-02-09 (×2): qty 30, 30d supply, fill #3
  Filled 2022-03-07: qty 30, 30d supply, fill #4
  Filled 2022-03-28 – 2022-04-04 (×2): qty 30, 30d supply, fill #5
  Filled 2022-05-03: qty 30, 30d supply, fill #6
  Filled 2022-05-26 – 2022-05-27 (×3): qty 30, 30d supply, fill #7
  Filled 2022-06-17 – 2022-06-23 (×2): qty 30, 30d supply, fill #8
  Filled 2022-07-25: qty 30, 30d supply, fill #9
  Filled 2022-08-16: qty 30, 30d supply, fill #10
  Filled ????-??-??: fill #10

## 2021-10-19 ENCOUNTER — Other Ambulatory Visit: Payer: Self-pay

## 2021-10-20 DIAGNOSIS — E349 Endocrine disorder, unspecified: Secondary | ICD-10-CM | POA: Diagnosis not present

## 2021-11-02 ENCOUNTER — Other Ambulatory Visit: Payer: Self-pay

## 2021-11-19 ENCOUNTER — Other Ambulatory Visit: Payer: Self-pay

## 2021-11-22 ENCOUNTER — Other Ambulatory Visit: Payer: Self-pay

## 2021-11-23 ENCOUNTER — Other Ambulatory Visit: Payer: Self-pay

## 2021-11-24 ENCOUNTER — Other Ambulatory Visit: Payer: Self-pay

## 2021-11-25 ENCOUNTER — Other Ambulatory Visit: Payer: Self-pay

## 2021-11-26 ENCOUNTER — Other Ambulatory Visit: Payer: Self-pay

## 2021-12-06 ENCOUNTER — Other Ambulatory Visit: Payer: Self-pay

## 2021-12-14 ENCOUNTER — Other Ambulatory Visit: Payer: Self-pay

## 2021-12-15 ENCOUNTER — Other Ambulatory Visit: Payer: Self-pay

## 2021-12-20 ENCOUNTER — Ambulatory Visit (INDEPENDENT_AMBULATORY_CARE_PROVIDER_SITE_OTHER): Payer: 59

## 2021-12-20 ENCOUNTER — Ambulatory Visit
Admission: EM | Admit: 2021-12-20 | Discharge: 2021-12-20 | Disposition: A | Discharge status: Home / Self Care | Payer: 59 | Attending: Physician Assistant | Admitting: Physician Assistant

## 2021-12-20 ENCOUNTER — Emergency Department
Admission: EM | Admit: 2021-12-20 | Discharge: 2021-12-20 | Disposition: A | Discharge status: Home / Self Care | Payer: 59

## 2021-12-20 ENCOUNTER — Encounter: Payer: Self-pay | Admitting: Emergency Medicine

## 2021-12-20 DIAGNOSIS — R109 Unspecified abdominal pain: Secondary | ICD-10-CM | POA: Insufficient documentation

## 2021-12-20 DIAGNOSIS — N200 Calculus of kidney: Secondary | ICD-10-CM | POA: Diagnosis not present

## 2021-12-20 LAB — URINALYSIS, ROUTINE W REFLEX MICROSCOPIC
Glucose, UA: NEGATIVE mg/dL
Leukocytes,Ua: NEGATIVE
Nitrite: NEGATIVE
Protein, ur: 30 mg/dL — AB
Specific Gravity, Urine: 1.02 (ref 1.005–1.030)
pH: 5.5 (ref 5.0–8.0)

## 2021-12-20 LAB — URINALYSIS, MICROSCOPIC (REFLEX)

## 2021-12-20 MED ORDER — HYDROCODONE-ACETAMINOPHEN 5-325 MG PO TABS: 1.0000 | ORAL_TABLET | Freq: Four times a day (QID) | ORAL | 0 refills | Status: AC | PRN

## 2021-12-20 MED ORDER — TAMSULOSIN HCL 0.4 MG PO CAPS: 0.4000 mg | ORAL_CAPSULE | Freq: Every day | ORAL | 0 refills | Status: AC

## 2021-12-20 NOTE — ED Notes (Signed)
Pt leaving due to wait time. 

## 2021-12-20 NOTE — ED Provider Notes (Signed)
[facility]    CSN: [id number] Arrival date & time: 12/20/21  1434      History   Chief Complaint Chief Complaint  Patient presents with   Back Pain    HPI Dustin Love is a 36 y.o. male presenting for onset of left-sided flank pain earlier this morning around 5 AM.  He says the pain started a little higher and has now moved a little lower and sort of radiates into his left lower abdomen.  He reports a sensation in the tip of his penis.  He says he believes he has a kidney stone because he has had numerous before and this is how his symptoms have been before.  He has taken ibuprofen and Tylenol for the pain without much relief.  He denies any associated nausea or vomiting, hematuria, or frequency or urgency.  No painful urination.  No other complaints.  HPI  History reviewed. No pertinent past medical history.  There are no problems to display for this patient.   Past Surgical History:  Procedure Laterality Date   NO PAST SURGERIES         Home Medications    Prior to Admission medications   Medication Sig Start Date End Date Taking? Authorizing Provider  esomeprazole (NEXIUM) 40 MG capsule Take 40 mg by mouth daily at 12 noon.   Yes [provider]  esomeprazole (NEXIUM) 40 MG capsule Take 1 capsule (40 mg total) by mouth once daily 09/16/21  Yes   HYDROcodone-acetaminophen (NORCO/VICODIN) 5-325 MG tablet Take 1 tablet by mouth every 6 (six) hours as needed for up to 3 days. 12/20/21 12/23/21 Yes Shirlee Latch, PA-C  tamsulosin (FLOMAX) 0.4 MG CAPS capsule Take 1 capsule (0.4 mg total) by mouth daily for 10 days. 12/20/21 12/30/21 Yes Shirlee Latch, PA-C  diphenoxylate-atropine (LOMOTIL) 2.5-0.025 MG tablet Take 1 tablet by mouth 4 (four) times daily as needed for diarrhea or loose stools. 09/26/17   Tommie Sams, DO  fluticasone (FLONASE) 50 MCG/ACT nasal spray Place 1 spray into both nostrils daily. 05/06/20 05/06/21  Ward, Layla Maw, DO   meloxicam (MOBIC) 15 MG tablet Take 1 tablet every day by oral route. 05/24/21     potassium chloride SA (K-DUR,KLOR-CON) 20 MEQ tablet Take 2 tablets (40 mEq total) by mouth 2 (two) times daily. 09/26/17   Tommie Sams, DO  sulfamethoxazole-trimethoprim (BACTRIM DS) 800-160 MG tablet Take 1 tablet by mouth 2 (two) times daily. 07/19/21   Cuthriell, Delorise Royals, PA-C  testosterone cypionate (DEPOTESTOSTERONE CYPIONATE) 200 MG/ML injection Inject 0.5 mLs (100 mg total) into the muscle once a week 07/16/21     Tuberculin-Allergy Syringes (B-D TB SYRINGE 1CC/21GX1") 21G X 1" 1 ML MISC For IM Testosterone Injections. May adjust needle size per patient preference 01/06/21       Family History Family History  Problem Relation Age of Onset   Diabetes Father    Hypertension Father     Social History Social History   Tobacco Use   Smoking status: Never   Smokeless tobacco: Never  Vaping Use   Vaping Use: Never used  Substance Use Topics   Alcohol use: Not Currently   Drug use: No     Allergies   Penicillin g and Amoxicillin   Review of Systems Review of Systems  Constitutional:  Negative for fatigue and fever.  Gastrointestinal:  Negative for abdominal pain, diarrhea, nausea and vomiting.  Genitourinary:  Positive for flank pain. Negative for  dysuria, frequency, genital sores, hematuria, penile discharge, penile pain and urgency.  Musculoskeletal:  Positive for back pain.     Physical Exam Triage Vital Signs ED Triage Vitals  Enc Vitals Group     BP      Pulse      Resp      Temp      Temp src      SpO2      Weight      Height      Head Circumference      Peak Flow      Pain Score      Pain Loc      Pain Edu?      Excl. in GC?    No data found.  Updated Vital Signs BP (!) 150/95 (BP Location: Right Arm)   Pulse 76   Temp 98.5 F (36.9 C) (Oral)   Resp 18   SpO2 98%    Physical Exam Vitals and nursing note reviewed.  Constitutional:      General: He is not  in acute distress.    Appearance: Normal appearance. He is well-developed. He is not ill-appearing.  HENT:     Head: Normocephalic and atraumatic.  Eyes:     General: No scleral icterus.    Conjunctiva/sclera: Conjunctivae normal.  Cardiovascular:     Rate and Rhythm: Normal rate and regular rhythm.     Heart sounds: Normal heart sounds.  Pulmonary:     Effort: Pulmonary effort is normal. No respiratory distress.     Breath sounds: Normal breath sounds.  Abdominal:     Palpations: Abdomen is soft.     Tenderness: There is no abdominal tenderness. There is left CVA tenderness. There is no right CVA tenderness.  Musculoskeletal:     Cervical back: Neck supple.  Skin:    General: Skin is warm and dry.     Capillary Refill: Capillary refill takes less than 2 seconds.  Neurological:     General: No focal deficit present.     Mental Status: He is alert. Mental status is at baseline.     Motor: No weakness.     Gait: Gait normal.  Psychiatric:        Mood and Affect: Mood normal.        Behavior: Behavior normal.      UC Treatments / Results  Labs (all labs ordered are listed, but only abnormal results are displayed) Labs Reviewed  URINALYSIS, ROUTINE W REFLEX MICROSCOPIC - Abnormal; Notable for the following components:      Result Value   Hgb urine dipstick MODERATE (*)    Bilirubin Urine SMALL (*)    Ketones, ur TRACE (*)    Protein, ur 30 (*)    All other components within normal limits  URINALYSIS, MICROSCOPIC (REFLEX) - Abnormal; Notable for the following components:   Bacteria, UA FEW (*)    All other components within normal limits    EKG   Radiology DG Abdomen 1 View  Result Date: 12/20/2021 CLINICAL DATA:  Left flank pain.  History of nephrolithiasis. EXAM: ABDOMEN - 1 VIEW COMPARISON:  None Available. FINDINGS: 2.5 mm calcification projecting in the left lower pelvis could conceivably be a left UVJ stone, and was not explicitly seen on prior radiographs  from 01/12/2017. Main differential diagnostic consideration would be a vascular calcification such as phlebolith. Densities projecting over both CT kidneys are probably in the fecal stream and less likely to be small stones.  Unremarkable bowel gas pattern. IMPRESSION: 1. 2.5 mm calcification in the left lower pelvis could conceivably be a left UVJ stone, and was not explicitly seen on prior radiographs from 01/12/2017. Main differential diagnostic consideration would be a vascular calcification such as a phlebolith. Electronically Signed   By: Gaylyn Rong M.D.   On: 12/20/2021 16:59    Procedures Procedures (including critical care time)  Medications Ordered in UC Medications - No data to display  Initial Impression / Assessment and Plan / UC Course  I have reviewed the triage vital signs and the nursing notes.  Pertinent labs & imaging results that were available during my care of the patient were reviewed by me and considered in my medical decision making (see chart for details).   36 year old male with history of nephrolithiasis presents for left-sided flank pain since early this morning.  Pain radiates to the left lower abdomen and groin.  Denies any urinary symptoms, fever.  He is afebrile and overall well-appearing.  Mild left CVA tenderness.  No abdominal tenderness.  Chest clear auscultation heart regular rate and rhythm.  UA shows moderate hemoglobin and trace ketones.  KUB obtained shows suspected 2.5 mm nephrolithiasis.  Discussed all results with patient.  Consistent with nephrolithiasis.  We will treat at this time with Flomax, rest and fluids, Norco as needed for severe pain.  Reviewed controlled substance database and find him to be low risk for abuse.  Reviewed ED precautions.  Patient declines in AVS.   Final Clinical Impressions(s) / UC Diagnoses   Final diagnoses:  Nephrolithiasis  Left flank pain   Discharge Instructions   None    ED Prescriptions      Medication Sig Dispense Auth. Provider   tamsulosin (FLOMAX) 0.4 MG CAPS capsule Take 1 capsule (0.4 mg total) by mouth daily for 10 days. 10 capsule Shirlee Latch, PA-C   HYDROcodone-acetaminophen (NORCO/VICODIN) 5-325 MG tablet Take 1 tablet by mouth every 6 (six) hours as needed for up to 3 days. 9 tablet Shirlee Latch, PA-C      I have reviewed the PDMP during this encounter.   Shirlee Latch, PA-C 12/20/21 1731

## 2021-12-20 NOTE — ED Triage Notes (Signed)
Pt presents with left side lower back pain that started this morning. Pt has a history of kidney stones.

## 2022-01-04 ENCOUNTER — Other Ambulatory Visit: Payer: Self-pay

## 2022-01-10 ENCOUNTER — Other Ambulatory Visit: Payer: Self-pay

## 2022-01-26 ENCOUNTER — Other Ambulatory Visit: Payer: Self-pay

## 2022-02-09 ENCOUNTER — Other Ambulatory Visit: Payer: Self-pay

## 2022-02-10 ENCOUNTER — Other Ambulatory Visit: Payer: Self-pay

## 2022-02-17 ENCOUNTER — Other Ambulatory Visit: Payer: Self-pay

## 2022-03-04 ENCOUNTER — Other Ambulatory Visit: Payer: Self-pay

## 2022-03-06 ENCOUNTER — Other Ambulatory Visit: Payer: Self-pay

## 2022-03-07 ENCOUNTER — Other Ambulatory Visit: Payer: Self-pay

## 2022-03-07 MED ORDER — TESTOSTERONE CYPIONATE 200 MG/ML IM SOLN
100.0000 mg | INTRAMUSCULAR | 0 refills | Status: DC
Start: 2022-03-07 — End: 2022-06-22
  Filled 2022-03-07: qty 2, 28d supply, fill #0
  Filled 2022-03-07: qty 5, 70d supply, fill #0
  Filled 2022-03-07 – 2022-03-21 (×2): qty 2, 28d supply, fill #0
  Filled 2022-05-03: qty 2, 28d supply, fill #1
  Filled 2022-06-17: qty 1, 14d supply, fill #2

## 2022-03-21 ENCOUNTER — Other Ambulatory Visit: Payer: Self-pay

## 2022-03-28 ENCOUNTER — Other Ambulatory Visit: Payer: Self-pay

## 2022-04-06 DIAGNOSIS — H5213 Myopia, bilateral: Secondary | ICD-10-CM | POA: Diagnosis not present

## 2022-04-13 DIAGNOSIS — E349 Endocrine disorder, unspecified: Secondary | ICD-10-CM | POA: Diagnosis not present

## 2022-04-15 DIAGNOSIS — E349 Endocrine disorder, unspecified: Secondary | ICD-10-CM | POA: Diagnosis not present

## 2022-05-03 ENCOUNTER — Other Ambulatory Visit: Payer: Self-pay

## 2022-05-26 ENCOUNTER — Other Ambulatory Visit: Payer: Self-pay

## 2022-05-27 ENCOUNTER — Other Ambulatory Visit: Payer: Self-pay

## 2022-06-01 ENCOUNTER — Other Ambulatory Visit: Payer: Self-pay

## 2022-06-17 ENCOUNTER — Other Ambulatory Visit: Payer: Self-pay

## 2022-06-20 ENCOUNTER — Other Ambulatory Visit: Payer: Self-pay

## 2022-06-20 DIAGNOSIS — M25562 Pain in left knee: Secondary | ICD-10-CM | POA: Diagnosis not present

## 2022-06-20 DIAGNOSIS — R222 Localized swelling, mass and lump, trunk: Secondary | ICD-10-CM | POA: Diagnosis not present

## 2022-06-20 DIAGNOSIS — G8929 Other chronic pain: Secondary | ICD-10-CM | POA: Diagnosis not present

## 2022-06-20 DIAGNOSIS — M25561 Pain in right knee: Secondary | ICD-10-CM | POA: Diagnosis not present

## 2022-06-20 DIAGNOSIS — L729 Follicular cyst of the skin and subcutaneous tissue, unspecified: Secondary | ICD-10-CM | POA: Diagnosis not present

## 2022-06-20 MED ORDER — MELOXICAM 15 MG PO TABS
15.0000 mg | ORAL_TABLET | Freq: Every day | ORAL | 1 refills | Status: AC
Start: 2022-06-20 — End: ?
  Filled 2022-06-20: qty 90, 90d supply, fill #0
  Filled 2023-02-20: qty 90, 90d supply, fill #1

## 2022-06-21 ENCOUNTER — Other Ambulatory Visit: Payer: Self-pay

## 2022-06-22 ENCOUNTER — Other Ambulatory Visit: Payer: Self-pay

## 2022-06-22 MED ORDER — TESTOSTERONE CYPIONATE 200 MG/ML IM SOLN
100.0000 mg | INTRAMUSCULAR | 3 refills | Status: AC
  Filled 2022-07-04: qty 2, 28d supply, fill #0
  Filled 2022-08-02: qty 2, 28d supply, fill #1
  Filled 2022-09-05: qty 2, 28d supply, fill #2
  Filled 2022-10-07: qty 2, 28d supply, fill #3

## 2022-06-23 ENCOUNTER — Other Ambulatory Visit: Payer: Self-pay

## 2022-07-04 ENCOUNTER — Other Ambulatory Visit: Payer: Self-pay

## 2022-07-25 ENCOUNTER — Other Ambulatory Visit: Payer: Self-pay

## 2022-08-02 ENCOUNTER — Other Ambulatory Visit: Payer: Self-pay

## 2022-08-16 ENCOUNTER — Other Ambulatory Visit: Payer: Self-pay

## 2022-09-05 ENCOUNTER — Other Ambulatory Visit: Payer: Self-pay

## 2022-09-16 ENCOUNTER — Other Ambulatory Visit: Payer: Self-pay

## 2022-09-20 ENCOUNTER — Other Ambulatory Visit: Payer: Self-pay

## 2022-09-20 MED ORDER — ESOMEPRAZOLE MAGNESIUM 40 MG PO CPDR
40.0000 mg | DELAYED_RELEASE_CAPSULE | Freq: Every day | ORAL | 1 refills | Status: DC
  Filled 2022-09-20: qty 90, 90d supply, fill #0
  Filled 2022-12-20: qty 90, 90d supply, fill #1

## 2022-10-07 ENCOUNTER — Other Ambulatory Visit: Payer: Self-pay

## 2022-10-18 ENCOUNTER — Other Ambulatory Visit: Payer: Self-pay

## 2022-10-18 DIAGNOSIS — E349 Endocrine disorder, unspecified: Secondary | ICD-10-CM | POA: Diagnosis not present

## 2022-10-18 MED ORDER — TESTOSTERONE CYPIONATE 200 MG/ML IM SOLN
150.0000 mg | INTRAMUSCULAR | 3 refills | Status: AC
  Filled 2022-10-18: qty 5, 30d supply, fill #0
  Filled 2022-11-15: qty 5, 35d supply, fill #0
  Filled 2022-12-20: qty 5, 35d supply, fill #1
  Filled 2023-02-15: qty 5, 35d supply, fill #2
  Filled 2023-04-10 – 2023-04-11 (×2): qty 5, 35d supply, fill #3

## 2022-10-24 DIAGNOSIS — Z0000 Encounter for general adult medical examination without abnormal findings: Secondary | ICD-10-CM | POA: Diagnosis not present

## 2022-10-24 DIAGNOSIS — R7989 Other specified abnormal findings of blood chemistry: Secondary | ICD-10-CM | POA: Diagnosis not present

## 2022-10-24 DIAGNOSIS — Z1331 Encounter for screening for depression: Secondary | ICD-10-CM | POA: Diagnosis not present

## 2022-10-24 DIAGNOSIS — K219 Gastro-esophageal reflux disease without esophagitis: Secondary | ICD-10-CM | POA: Diagnosis not present

## 2022-11-15 ENCOUNTER — Other Ambulatory Visit: Payer: Self-pay

## 2022-11-21 DIAGNOSIS — R7989 Other specified abnormal findings of blood chemistry: Secondary | ICD-10-CM | POA: Diagnosis not present

## 2022-11-21 DIAGNOSIS — E349 Endocrine disorder, unspecified: Secondary | ICD-10-CM | POA: Diagnosis not present

## 2022-12-20 ENCOUNTER — Other Ambulatory Visit: Payer: Self-pay

## 2023-01-17 DIAGNOSIS — R7989 Other specified abnormal findings of blood chemistry: Secondary | ICD-10-CM | POA: Diagnosis not present

## 2023-01-17 DIAGNOSIS — K760 Fatty (change of) liver, not elsewhere classified: Secondary | ICD-10-CM | POA: Diagnosis not present

## 2023-01-17 DIAGNOSIS — R1310 Dysphagia, unspecified: Secondary | ICD-10-CM | POA: Diagnosis not present

## 2023-01-17 DIAGNOSIS — K219 Gastro-esophageal reflux disease without esophagitis: Secondary | ICD-10-CM | POA: Diagnosis not present

## 2023-02-15 ENCOUNTER — Other Ambulatory Visit: Payer: Self-pay

## 2023-02-20 ENCOUNTER — Other Ambulatory Visit: Payer: Self-pay

## 2023-03-06 ENCOUNTER — Other Ambulatory Visit: Payer: Self-pay

## 2023-03-07 ENCOUNTER — Other Ambulatory Visit: Payer: Self-pay

## 2023-03-09 ENCOUNTER — Other Ambulatory Visit: Payer: Self-pay

## 2023-03-09 MED ORDER — ESOMEPRAZOLE MAGNESIUM 40 MG PO CPDR
40.0000 mg | DELAYED_RELEASE_CAPSULE | Freq: Every day | ORAL | 1 refills | Status: DC
  Filled 2023-06-01: qty 90, 90d supply, fill #1

## 2023-03-29 DIAGNOSIS — R030 Elevated blood-pressure reading, without diagnosis of hypertension: Secondary | ICD-10-CM | POA: Diagnosis not present

## 2023-03-29 DIAGNOSIS — R112 Nausea with vomiting, unspecified: Secondary | ICD-10-CM | POA: Diagnosis not present

## 2023-04-10 ENCOUNTER — Other Ambulatory Visit: Payer: Self-pay

## 2023-04-11 ENCOUNTER — Other Ambulatory Visit: Payer: Self-pay

## 2023-04-18 DIAGNOSIS — E349 Endocrine disorder, unspecified: Secondary | ICD-10-CM | POA: Diagnosis not present

## 2023-04-21 DIAGNOSIS — E349 Endocrine disorder, unspecified: Secondary | ICD-10-CM | POA: Diagnosis not present

## 2023-04-25 ENCOUNTER — Encounter: Payer: Self-pay | Admitting: *Deleted

## 2023-05-05 ENCOUNTER — Encounter
Admission: RE | Disposition: A | Discharge status: Home / Self Care | Payer: Self-pay | Source: Ambulatory Visit | Attending: Gastroenterology

## 2023-05-05 ENCOUNTER — Ambulatory Visit: Admitting: Anesthesiology

## 2023-05-05 ENCOUNTER — Ambulatory Visit
Admission: RE | Admit: 2023-05-05 | Discharge: 2023-05-05 | Disposition: A | Discharge status: Home / Self Care | Payer: Commercial Managed Care - PPO | Source: Ambulatory Visit | Attending: Gastroenterology | Admitting: Gastroenterology

## 2023-05-05 DIAGNOSIS — K449 Diaphragmatic hernia without obstruction or gangrene: Secondary | ICD-10-CM | POA: Insufficient documentation

## 2023-05-05 DIAGNOSIS — K2270 Barrett's esophagus without dysplasia: Secondary | ICD-10-CM | POA: Diagnosis not present

## 2023-05-05 DIAGNOSIS — Z79899 Other long term (current) drug therapy: Secondary | ICD-10-CM | POA: Insufficient documentation

## 2023-05-05 DIAGNOSIS — K2970 Gastritis, unspecified, without bleeding: Secondary | ICD-10-CM | POA: Diagnosis not present

## 2023-05-05 DIAGNOSIS — Z791 Long term (current) use of non-steroidal anti-inflammatories (NSAID): Secondary | ICD-10-CM | POA: Insufficient documentation

## 2023-05-05 DIAGNOSIS — K2100 Gastro-esophageal reflux disease with esophagitis, without bleeding: Secondary | ICD-10-CM | POA: Diagnosis not present

## 2023-05-05 DIAGNOSIS — K3189 Other diseases of stomach and duodenum: Secondary | ICD-10-CM | POA: Diagnosis not present

## 2023-05-05 DIAGNOSIS — K319 Disease of stomach and duodenum, unspecified: Secondary | ICD-10-CM | POA: Diagnosis not present

## 2023-05-05 HISTORY — DX: Fatty (change of) liver, not elsewhere classified: K76.0

## 2023-05-05 HISTORY — DX: Cyst of spleen: D73.4

## 2023-05-05 HISTORY — PX: ESOPHAGOGASTRODUODENOSCOPY (EGD) WITH PROPOFOL: SHX5813

## 2023-05-05 HISTORY — DX: Gastro-esophageal reflux disease without esophagitis: K21.9

## 2023-05-05 SURGERY — ESOPHAGOGASTRODUODENOSCOPY (EGD) WITH PROPOFOL
Anesthesia: General

## 2023-05-05 MED ORDER — LIDOCAINE HCL (CARDIAC) PF 100 MG/5ML IV SOSY
PREFILLED_SYRINGE | INTRAVENOUS | Status: DC | PRN
  Administered 2023-05-05: 100 mg via INTRAVENOUS

## 2023-05-05 MED ORDER — PROPOFOL 500 MG/50ML IV EMUL
INTRAVENOUS | Status: DC | PRN
  Administered 2023-05-05: 200 ug/kg/min via INTRAVENOUS

## 2023-05-05 MED ORDER — PROPOFOL 10 MG/ML IV BOLUS
INTRAVENOUS | Status: DC | PRN
  Administered 2023-05-05: 200 mg via INTRAVENOUS

## 2023-05-05 MED ORDER — SODIUM CHLORIDE 0.9 % IV SOLN
INTRAVENOUS | Status: DC
  Administered 2023-05-05: 20 mL/h via INTRAVENOUS

## 2023-05-05 MED ORDER — LIDOCAINE HCL (PF) 2 % IJ SOLN
INTRAMUSCULAR | Status: AC
  Filled 2023-05-05: qty 5

## 2023-05-05 MED ORDER — PROPOFOL 10 MG/ML IV BOLUS
INTRAVENOUS | Status: AC
  Filled 2023-05-05: qty 20

## 2023-05-05 MED ORDER — LIDOCAINE HCL (PF) 2 % IJ SOLN
INTRAMUSCULAR | Status: AC
Start: 2023-05-05 — End: ?
  Filled 2023-05-05: qty 5

## 2023-05-05 NOTE — Interval H&P Note (Signed)
 History and Physical Interval Note:  05/05/2023 9:00 AM  Dustin Love  has presented today for surgery, with the diagnosis of gerd.  The various methods of treatment have been discussed with the patient and family. After consideration of risks, benefits and other options for treatment, the patient has consented to  Procedure(s): ESOPHAGOGASTRODUODENOSCOPY (EGD) WITH PROPOFOL (N/A) as a surgical intervention.  The patient's history has been reviewed, patient examined, no change in status, stable for surgery.  I have reviewed the patient's chart and labs.  Questions were answered to the patient's satisfaction.     Regis Bill  Ok to proceed with EGD

## 2023-05-05 NOTE — Anesthesia Preprocedure Evaluation (Signed)
 Anesthesia Evaluation  Patient identified by MRN, date of birth, ID band Patient awake    Reviewed: Allergy & Precautions, NPO status , Patient's Chart, lab work & pertinent test results  History of Anesthesia Complications Negative for: history of anesthetic complications  Airway Mallampati: III  TM Distance: <3 FB Neck ROM: full    Dental  (+) Chipped, Poor Dentition, Missing   Pulmonary neg pulmonary ROS, neg shortness of breath   Pulmonary exam normal        Cardiovascular Exercise Tolerance: Good (-) angina negative cardio ROS Normal cardiovascular exam     Neuro/Psych negative neurological ROS  negative psych ROS   GI/Hepatic Neg liver ROS,GERD  Controlled,,  Endo/Other  negative endocrine ROS    Renal/GU negative Renal ROS  negative genitourinary   Musculoskeletal   Abdominal   Peds  Hematology negative hematology ROS (+)   Anesthesia Other Findings Past Medical History: No date: GERD (gastroesophageal reflux disease) No date: Hepatic steatosis No date: Splenic cyst  Past Surgical History: No date: NO PAST SURGERIES  BMI    Body Mass Index: 31.93 kg/m      Reproductive/Obstetrics negative OB ROS                             Anesthesia Physical Anesthesia Plan  ASA: 2  Anesthesia Plan: General   Post-op Pain Management:    Induction: Intravenous  PONV Risk Score and Plan: Propofol infusion and TIVA  Airway Management Planned: Natural Airway and Nasal Cannula  Additional Equipment:   Intra-op Plan:   Post-operative Plan:   Informed Consent: I have reviewed the patients History and Physical, chart, labs and discussed the procedure including the risks, benefits and alternatives for the proposed anesthesia with the patient or authorized representative who has indicated his/her understanding and acceptance.     Dental Advisory Given  Plan Discussed with:  Anesthesiologist, CRNA and Surgeon  Anesthesia Plan Comments: (Patient consented for risks of anesthesia including but not limited to:  - adverse reactions to medications - risk of airway placement if required - damage to eyes, teeth, lips or other oral mucosa - nerve damage due to positioning  - sore throat or hoarseness - Damage to heart, brain, nerves, lungs, other parts of body or loss of life  Patient voiced understanding and assent.)       Anesthesia Quick Evaluation

## 2023-05-05 NOTE — Op Note (Signed)
 [facility] Patient Name: Dustin Love Procedure Date: 05/05/2023 8:45 AM MRN: [id number] Account #: [id number] Date of Birth: 08-Oct-1985 Admit Type: Outpatient Age: 38 Room: [facility] ENDO ROOM 3 Gender: Male Note Status: Finalized Instrument Name: Upper Endoscope [id number] Procedure:             Upper GI endoscopy Indications:           Gastro-esophageal reflux disease Providers:             Eather Colas MD, MD Medicines:             Monitored Anesthesia Care Complications:         No immediate complications. Estimated blood loss:                         Minimal. Procedure:             Pre-Anesthesia Assessment:                        - Prior to the procedure, a History and Physical was                         performed, and patient medications and allergies were                         reviewed. The patient is competent. The risks and                         benefits of the procedure and the sedation options and                         risks were discussed with the patient. All questions                         were answered and informed consent was obtained.                         Patient identification and proposed procedure were                         verified by the physician, the nurse, the                         anesthesiologist, the anesthetist and the technician                         in the endoscopy suite. Mental Status Examination:                         alert and oriented. Airway Examination: normal                         oropharyngeal airway and neck mobility. Respiratory                         Examination: clear to auscultation. CV Examination:                         normal. Prophylactic Antibiotics: The patient does not  require prophylactic antibiotics. Prior                         Anticoagulants: The patient has taken no anticoagulant                         or antiplatelet agents. ASA Grade  Assessment: II - A                         patient with mild systemic disease. After reviewing                         the risks and benefits, the patient was deemed in                         satisfactory condition to undergo the procedure. The                         anesthesia plan was to use monitored anesthesia care                         (MAC). Immediately prior to administration of                         medications, the patient was re-assessed for adequacy                         to receive sedatives. The heart rate, respiratory                         rate, oxygen saturations, blood pressure, adequacy of                         pulmonary ventilation, and response to care were                         monitored throughout the procedure. The physical                         status of the patient was re-assessed after the                         procedure.                        After obtaining informed consent, the endoscope was                         passed under direct vision. Throughout the procedure,                         the patient's blood pressure, pulse, and oxygen                         saturations were monitored continuously. The Endoscope                         was introduced through the mouth, and advanced to the  second part of duodenum. The upper GI endoscopy was                         accomplished without difficulty. The patient tolerated                         the procedure well. Findings:      A 5 cm hiatal hernia was present.      Three tongues of salmon-colored mucosa were present. No other visible       abnormalities were present. The maximum longitudinal extent of these       esophageal mucosal changes was 2 cm in length. Biopsies were taken with       a cold forceps for histology. Estimated blood loss was minimal.      The exam of the esophagus was otherwise normal.      The entire examined stomach was normal. Biopsies were taken  with a cold       forceps for Helicobacter pylori testing. Estimated blood loss was       minimal.      The examined duodenum was normal. Impression:            - 5 cm hiatal hernia.                        - Salmon-colored mucosa classified as Barrett's stage                         C0-M2 per Prague criteria. Biopsied.                        - Normal stomach. Biopsied.                        - Normal examined duodenum. Recommendation:        - Discharge patient to home.                        - Resume previous diet.                        - Continue present medications.                        - Await pathology results.                        - Use a proton pump inhibitor PO daily. Procedure Code(s):     --- Professional ---                        [phone], Esophagogastroduodenoscopy, flexible,                         transoral; with biopsy, single or multiple Diagnosis Code(s):     --- Professional ---                        K44.9, Diaphragmatic hernia without obstruction or                         gangrene  K22.70, Barrett's esophagus without dysplasia                        K21.9, Gastro-esophageal reflux disease without                         esophagitis CPT copyright 2022 American Medical Association. All rights reserved. The codes documented in this report are preliminary and upon coder review may  be revised to meet current compliance requirements. Eather Colas MD, MD 05/05/2023 9:25:39 AM Number of Addenda: 0 Note Initiated On: 05/05/2023 8:45 AM Estimated Blood Loss:  Estimated blood loss was minimal.      [facility]

## 2023-05-05 NOTE — Anesthesia Postprocedure Evaluation (Signed)
 Anesthesia Post Note  Patient: Dustin Love  Procedure(s) Performed: ESOPHAGOGASTRODUODENOSCOPY (EGD) WITH PROPOFOL  Patient location during evaluation: Endoscopy Anesthesia Type: General Level of consciousness: awake and alert Pain management: pain level controlled Vital Signs Assessment: post-procedure vital signs reviewed and stable Respiratory status: spontaneous breathing, nonlabored ventilation, respiratory function stable and patient connected to nasal cannula oxygen Cardiovascular status: blood pressure returned to baseline and stable Postop Assessment: no apparent nausea or vomiting Anesthetic complications: no   No notable events documented.   Last Vitals:  Vitals:   05/05/23 0936 05/05/23 0947  BP: (!) 133/95 123/84  Pulse: 77 78  Resp: 16 11  Temp:    SpO2: 98% 98%    Last Pain:  Vitals:   05/05/23 0936  TempSrc:   PainSc: 0-No pain                 Cleda Mccreedy Tiffini Blacksher

## 2023-05-05 NOTE — Transfer of Care (Signed)
 Immediate Anesthesia Transfer of Care Note  Patient: Dustin Love  Procedure(s) Performed: ESOPHAGOGASTRODUODENOSCOPY (EGD) WITH PROPOFOL  Patient Location: Endoscopy Unit  Anesthesia Type:General  Level of Consciousness: drowsy and patient cooperative  Airway & Oxygen Therapy: Patient Spontanous Breathing  Post-op Assessment: Report given to RN and Post -op Vital signs reviewed and stable  Post vital signs: Reviewed and stable  Last Vitals:  Vitals Value Taken Time  BP 140/99 05/05/23 0924  Temp    Pulse 83 05/05/23 0927  Resp 7 05/05/23 0927  SpO2 99 % 05/05/23 0927  Vitals shown include unfiled device data.  Last Pain:  Vitals:   05/05/23 0924  TempSrc:   PainSc: 0-No pain         Complications: No notable events documented.

## 2023-05-05 NOTE — H&P (Signed)
 Outpatient short stay form Pre-procedure 05/05/2023  Regis Bill, MD  Primary Physician: [facility]  Reason for visit:  GERD  History of present illness:    38 y/o gentleman with history of GERD here for EGD. Denies any dysphagia. No blood thinners. No neck or abdominal surgeries.    Current Facility-Administered Medications:    0.9 %  sodium chloride infusion, , Intravenous, Continuous, Chelesa Weingartner, Rossie Muskrat, MD, Last Rate: 20 mL/hr at 05/05/23 0802, 20 mL/hr at 05/05/23 0802  Medications Prior to Admission  Medication Sig Dispense Refill Last Dose/Taking   diphenoxylate-atropine (LOMOTIL) 2.5-0.025 MG tablet Take 1 tablet by mouth 4 (four) times daily as needed for diarrhea or loose stools. 30 tablet 0 Past Week   esomeprazole (NEXIUM) 40 MG capsule Take 40 mg by mouth daily at 12 noon.   05/04/2023   esomeprazole (NEXIUM) 40 MG capsule Take 1 capsule (40 mg total) by mouth daily. 100 capsule 1 Past Week   meloxicam (MOBIC) 15 MG tablet Take 1 tablet every day by oral route. 30 tablet 1 Past Week   meloxicam (MOBIC) 15 MG tablet Take 1 tablet (15 mg total) by mouth daily. 90 tablet 1 Past Week   potassium chloride SA (K-DUR,KLOR-CON) 20 MEQ tablet Take 2 tablets (40 mEq total) by mouth 2 (two) times daily. 8 tablet 0 Past Week   sulfamethoxazole-trimethoprim (BACTRIM DS) 800-160 MG tablet Take 1 tablet by mouth 2 (two) times daily. 14 tablet 0 Past Week   testosterone cypionate (DEPOTESTOSTERONE CYPIONATE) 200 MG/ML injection Inject 0.5 mLs (100 mg total) into the muscle once a week. 5 mL 3 Past Week   testosterone cypionate (DEPOTESTOSTERONE CYPIONATE) 200 MG/ML injection Inject 0.75 mLs (150 mg total) into the skin once a week. 5 mL 3 Past Week   Tuberculin-Allergy Syringes (B-D TB SYRINGE 1CC/21GX1") 21G X 1" 1 ML MISC For IM Testosterone Injections. May adjust needle size per patient preference 25 each 1 Past Week   fluticasone (FLONASE) 50 MCG/ACT nasal spray Place  1 spray into both nostrils daily. 11.1 mL 0      Allergies  Allergen Reactions   Penicillin G Rash   Amoxicillin Rash     Past Medical History:  Diagnosis Date   GERD (gastroesophageal reflux disease)    Hepatic steatosis    Splenic cyst     Review of systems:  Otherwise negative.    Physical Exam  Gen: Alert, oriented. Appears stated age.  HEENT: PERRLA. Lungs: No respiratory distress CV: RRR Abd: soft, benign, no masses Ext: No edema    Planned procedures: Proceed with EGD. The patient understands the nature of the planned procedure, indications, risks, alternatives and potential complications including but not limited to bleeding, infection, perforation, damage to internal organs and possible oversedation/side effects from anesthesia. The patient agrees and gives consent to proceed.  Please refer to procedure notes for findings, recommendations and patient disposition/instructions.     Regis Bill, MD [facility]

## 2023-05-08 ENCOUNTER — Encounter: Payer: Self-pay | Admitting: Gastroenterology

## 2023-05-08 ENCOUNTER — Other Ambulatory Visit: Payer: Self-pay

## 2023-05-08 LAB — SURGICAL PATHOLOGY

## 2023-05-22 DIAGNOSIS — N200 Calculus of kidney: Secondary | ICD-10-CM | POA: Diagnosis not present

## 2023-05-25 ENCOUNTER — Encounter: Payer: Self-pay | Admitting: Emergency Medicine

## 2023-05-25 ENCOUNTER — Emergency Department

## 2023-05-25 ENCOUNTER — Other Ambulatory Visit: Payer: Self-pay

## 2023-05-25 ENCOUNTER — Emergency Department
Admission: EM | Admit: 2023-05-25 | Discharge: 2023-05-25 | Disposition: A | Discharge status: Home / Self Care | Attending: Emergency Medicine | Admitting: Emergency Medicine

## 2023-05-25 DIAGNOSIS — N289 Disorder of kidney and ureter, unspecified: Secondary | ICD-10-CM | POA: Diagnosis not present

## 2023-05-25 DIAGNOSIS — K429 Umbilical hernia without obstruction or gangrene: Secondary | ICD-10-CM | POA: Diagnosis not present

## 2023-05-25 DIAGNOSIS — N132 Hydronephrosis with renal and ureteral calculous obstruction: Secondary | ICD-10-CM | POA: Diagnosis not present

## 2023-05-25 DIAGNOSIS — N200 Calculus of kidney: Secondary | ICD-10-CM

## 2023-05-25 DIAGNOSIS — N201 Calculus of ureter: Secondary | ICD-10-CM

## 2023-05-25 DIAGNOSIS — N179 Acute kidney failure, unspecified: Secondary | ICD-10-CM | POA: Insufficient documentation

## 2023-05-25 DIAGNOSIS — K760 Fatty (change of) liver, not elsewhere classified: Secondary | ICD-10-CM | POA: Diagnosis not present

## 2023-05-25 DIAGNOSIS — R1031 Right lower quadrant pain: Secondary | ICD-10-CM | POA: Diagnosis present

## 2023-05-25 DIAGNOSIS — R109 Unspecified abdominal pain: Secondary | ICD-10-CM | POA: Diagnosis not present

## 2023-05-25 LAB — URINALYSIS, ROUTINE W REFLEX MICROSCOPIC
Bacteria, UA: NONE SEEN
Bilirubin Urine: NEGATIVE
Glucose, UA: NEGATIVE mg/dL
Ketones, ur: NEGATIVE mg/dL
Leukocytes,Ua: NEGATIVE
Nitrite: NEGATIVE
Protein, ur: 30 mg/dL — AB
RBC / HPF: >50 RBC/hpf (ref 0–5)
Specific Gravity, Urine: 1.023 (ref 1.005–1.030)
Squamous Epithelial / HPF: 0 /HPF (ref 0–5)
pH: 6 (ref 5.0–8.0)

## 2023-05-25 LAB — BASIC METABOLIC PANEL WITH GFR
Anion gap: 9 (ref 5–15)
BUN: 23 mg/dL — ABNORMAL HIGH (ref 6–20)
CO2: 23 mmol/L (ref 22–32)
Calcium: 8.8 mg/dL — ABNORMAL LOW (ref 8.9–10.3)
Chloride: 105 mmol/L (ref 98–111)
Creatinine, Ser: 2.77 mg/dL — ABNORMAL HIGH (ref 0.61–1.24)
GFR, Estimated: 29 mL/min — ABNORMAL LOW (ref >60)
Glucose, Bld: 102 mg/dL — ABNORMAL HIGH (ref 70–99)
Potassium: 3.9 mmol/L (ref 3.5–5.1)
Sodium: 137 mmol/L (ref 135–145)

## 2023-05-25 LAB — CBC
HCT: 45 % (ref 39.0–52.0)
Hemoglobin: 16.1 g/dL (ref 13.0–17.0)
MCH: 30.6 pg (ref 26.0–34.0)
MCHC: 35.8 g/dL (ref 30.0–36.0)
MCV: 85.4 fL (ref 80.0–100.0)
Platelets: 205 10*3/uL (ref 150–400)
RBC: 5.27 MIL/uL (ref 4.22–5.81)
RDW: 13.2 % (ref 11.5–15.5)
WBC: 7.7 10*3/uL (ref 4.0–10.5)
nRBC: 0 % (ref 0.0–0.2)

## 2023-05-25 MED ORDER — TAMSULOSIN HCL 0.4 MG PO CAPS
0.4000 mg | ORAL_CAPSULE | Freq: Every day | ORAL | 0 refills | Status: DC
  Filled 2023-05-25 – 2023-05-26 (×2): qty 10, 10d supply, fill #0

## 2023-05-25 MED ORDER — TRAMADOL HCL 50 MG PO TABS
50.0000 mg | ORAL_TABLET | Freq: Four times a day (QID) | ORAL | 0 refills | Status: AC | PRN
  Filled 2023-05-25: qty 20, 5d supply, fill #0

## 2023-05-25 MED ORDER — KETOROLAC TROMETHAMINE 15 MG/ML IJ SOLN
15.0000 mg | Freq: Once | INTRAMUSCULAR | Status: AC
  Administered 2023-05-25: 15 mg via INTRAMUSCULAR
  Filled 2023-05-25: qty 1

## 2023-05-25 NOTE — ED Notes (Signed)
 See triage note  Presents with intermittent right flank pain  States he was seen on Monday and dx'd with kidney stone  States pain has been intermittent and only lasts about 20 mins

## 2023-05-25 NOTE — Discharge Instructions (Addendum)
 Your CT shows a 5mm stone in the right ureter that is causing your pain. Continue to take flomax  and pain medicine as needed.  Be sure to drink lots of water to stay well-hydrated and help with stone passage.  Take tylenol  1000mg  three times per day, and add naproxen 500mg  every 12 hours as needed for additional pain relief.  Dr. Sharion Davidson office will contact you to arrange follow up in the office next week.

## 2023-05-25 NOTE — ED Triage Notes (Signed)
 Pt via POV from home. Pt c/o R sided flank pain since Monday, report was seen at a [facility] and was dx with kidney stone, states he is unable to tell if he has passed it but the pain is still intermittent. Denies fever. Denies pain now. Denies urinary symptoms now but states when it started he had blood in his urine. Pt has a hx of kidney stones. Pt is A&OX4, and NAD, ambulatory to triage.

## 2023-05-25 NOTE — ED Provider Notes (Signed)
 [facility] Provider Note    Event Date/Time   First MD Initiated Contact with Patient 05/25/23 0801     (approximate)   History   Chief Complaint: Flank Pain   HPI  Dustin Love is a 38 y.o. male with past history of GERD who comes ED complaining of right flank pain radiating to his right lower quadrant abdomen, ongoing for the last 3 days, off-and-on, colicky, at times severe.  Was seen at [facility] urgent care 3 days ago where labs showed hematuria, he was diagnosed with ureterolithiasis and prescribed Toradol  and Flomax .  He is been taking these, still having pain, no fever or chills or vomiting.        Past Medical History:  Diagnosis Date   GERD (gastroesophageal reflux disease)    Hepatic steatosis    Splenic cyst     Current Outpatient Rx   Order #: [id number] Class: Normal   Order #: [id number] Class: Historical Med   Order #: [id number] Class: Normal   Order #: [id number] Class: Normal   Order #: [id number] Class: Normal   Order #: [id number] Class: Normal   Order #: [id number] Class: Normal   Order #: [id number] Class: Normal   Order #: [id number] Class: Normal    Past Surgical History:  Procedure Laterality Date   ESOPHAGOGASTRODUODENOSCOPY (EGD) WITH PROPOFOL  N/A 05/05/2023   Procedure: ESOPHAGOGASTRODUODENOSCOPY (EGD) WITH PROPOFOL ;  Surgeon: Shane Darling, MD;  Location: [facility] ENDOSCOPY;  Service: Endoscopy;  Laterality: N/A;   NO PAST SURGERIES      Physical Exam   Triage Vital Signs: ED Triage Vitals  Encounter Vitals Group     BP 05/25/23 0741 (!) 174/106     Systolic BP Percentile --      Diastolic BP Percentile --      Pulse Rate 05/25/23 0741 83     Resp 05/25/23 0741 16     Temp 05/25/23 0741 98.1 F (36.7 C)     Temp Source 05/25/23 0741 Oral     SpO2 05/25/23 0741 100 %     Weight 05/25/23 0739 215 lb (97.5 kg)     Height 05/25/23 0739 5\' 9"  (1.753 m)     Head Circumference --      Peak Flow --      Pain Score  05/25/23 0739 0     Pain Loc --      Pain Education --      Exclude from Growth Chart --     Most recent vital signs: Vitals:   05/25/23 0741  BP: (!) 174/106  Pulse: 83  Resp: 16  Temp: 98.1 F (36.7 C)  SpO2: 100%    General: Awake, no distress.  CV:  Good peripheral perfusion.  Resp:  Normal effort.  Abd:  No distention.  Soft with mild right lower quadrant tenderness.  No CVA tenderness Other:  No muscular tenderness on the lower back   ED Results / Procedures / Treatments   Labs (all labs ordered are listed, but only abnormal results are displayed) Labs Reviewed  URINALYSIS, ROUTINE W REFLEX MICROSCOPIC - Abnormal; Notable for the following components:      Result Value   Color, Urine YELLOW (*)    APPearance CLOUDY (*)    Hgb urine dipstick LARGE (*)    Protein, ur 30 (*)    All other components within normal limits  BASIC METABOLIC PANEL WITH GFR - Abnormal; Notable for the following components:   Glucose, Bld 102 (*)    BUN  23 (*)    Creatinine, Ser 2.77 (*)    Calcium 8.8 (*)    GFR, Estimated 29 (*)    All other components within normal limits  CBC     EKG    RADIOLOGY CT interpreted by me, shows 5 mm right ureterolithiasis with mild hydronephrosis.  Radiology report reviewed noting  Mild right hydronephrosis  and low-density renal expansion due to a 5 mm stone in the upper  ureter near the gonadal crossing.     PROCEDURES:  Procedures   MEDICATIONS ORDERED IN ED: Medications  ketorolac  (TORADOL ) 15 MG/ML injection 15 mg (15 mg Intramuscular Given 05/25/23 0945)     IMPRESSION / MDM / ASSESSMENT AND PLAN / ED COURSE  I reviewed the triage vital signs and the nursing notes.  DDx: Ureterolithiasis, cystitis, electrolyte derangement, appendicitis, psoas strain  Patient's presentation is most consistent with acute presentation with potential threat to life or bodily function.  Patient presents with right flank pain,.  Not improving so  far with 3 days of conservative measures with Toradol  and Flomax .  Repeat urinalysis today shows persistent hematuria.  Will obtain CT abdomen pelvis to evaluate for obstructing stone versus appendicitis   ----------------------------------------- 9:46 AM on 05/25/2023 ----------------------------------------- CT confirms ureterolithiasis.  Labs show a mild AKI with creatinine today of 2.4 compared to 1.3 on November 21, 2022.  Discussed with cardiology Dr. Osa Blase who agrees with current management, will arrange office follow-up week.  Will transition patient to naproxen and Tylenol .  Also to drink lots of water and be sure he is urinating frequently for renal protection.      FINAL CLINICAL IMPRESSION(S) / ED DIAGNOSES   Final diagnoses:  Ureterolithiasis  Acute renal insufficiency     Rx / DC Orders   ED Discharge Orders     None        Note:  This document was prepared using Dragon voice recognition software and may include unintentional dictation errors.   Jacquie Maudlin, MD 05/25/23 [phone]

## 2023-05-26 ENCOUNTER — Other Ambulatory Visit: Payer: Self-pay

## 2023-05-26 DIAGNOSIS — H5213 Myopia, bilateral: Secondary | ICD-10-CM | POA: Diagnosis not present

## 2023-05-30 ENCOUNTER — Ambulatory Visit: Admitting: Urology

## 2023-06-01 ENCOUNTER — Other Ambulatory Visit: Payer: Self-pay

## 2023-06-03 DIAGNOSIS — I10 Essential (primary) hypertension: Secondary | ICD-10-CM | POA: Diagnosis not present

## 2023-06-03 DIAGNOSIS — N132 Hydronephrosis with renal and ureteral calculous obstruction: Secondary | ICD-10-CM | POA: Diagnosis not present

## 2023-06-03 DIAGNOSIS — N201 Calculus of ureter: Secondary | ICD-10-CM | POA: Diagnosis not present

## 2023-06-03 DIAGNOSIS — N200 Calculus of kidney: Secondary | ICD-10-CM | POA: Diagnosis not present

## 2023-06-03 DIAGNOSIS — N138 Other obstructive and reflux uropathy: Secondary | ICD-10-CM | POA: Diagnosis not present

## 2023-06-03 DIAGNOSIS — N179 Acute kidney failure, unspecified: Secondary | ICD-10-CM | POA: Diagnosis not present

## 2023-06-05 ENCOUNTER — Other Ambulatory Visit: Payer: Self-pay

## 2023-06-05 MED ORDER — TAMSULOSIN HCL 0.4 MG PO CAPS
0.4000 mg | ORAL_CAPSULE | Freq: Every day | ORAL | 0 refills | Status: AC
  Filled 2023-06-05: qty 30, 30d supply, fill #0

## 2023-06-13 ENCOUNTER — Other Ambulatory Visit: Payer: Self-pay

## 2023-07-11 DIAGNOSIS — K2270 Barrett's esophagus without dysplasia: Secondary | ICD-10-CM | POA: Diagnosis not present

## 2023-07-11 DIAGNOSIS — Z79899 Other long term (current) drug therapy: Secondary | ICD-10-CM | POA: Diagnosis not present

## 2023-07-11 DIAGNOSIS — K219 Gastro-esophageal reflux disease without esophagitis: Secondary | ICD-10-CM | POA: Diagnosis not present

## 2023-07-11 DIAGNOSIS — K449 Diaphragmatic hernia without obstruction or gangrene: Secondary | ICD-10-CM | POA: Diagnosis not present

## 2023-07-19 DIAGNOSIS — Z87442 Personal history of urinary calculi: Secondary | ICD-10-CM | POA: Diagnosis not present

## 2023-07-19 DIAGNOSIS — E66812 Obesity, class 2: Secondary | ICD-10-CM | POA: Diagnosis not present

## 2023-07-19 DIAGNOSIS — Z79899 Other long term (current) drug therapy: Secondary | ICD-10-CM | POA: Diagnosis not present

## 2023-07-19 DIAGNOSIS — N400 Enlarged prostate without lower urinary tract symptoms: Secondary | ICD-10-CM | POA: Diagnosis not present

## 2023-07-19 DIAGNOSIS — M170 Bilateral primary osteoarthritis of knee: Secondary | ICD-10-CM | POA: Diagnosis not present

## 2023-07-19 DIAGNOSIS — K449 Diaphragmatic hernia without obstruction or gangrene: Secondary | ICD-10-CM | POA: Diagnosis not present

## 2023-07-19 DIAGNOSIS — Z880 Allergy status to penicillin: Secondary | ICD-10-CM | POA: Diagnosis not present

## 2023-07-19 DIAGNOSIS — K219 Gastro-esophageal reflux disease without esophagitis: Secondary | ICD-10-CM | POA: Diagnosis not present

## 2023-07-19 DIAGNOSIS — Z6832 Body mass index (BMI) 32.0-32.9, adult: Secondary | ICD-10-CM | POA: Diagnosis not present

## 2023-07-19 DIAGNOSIS — K2270 Barrett's esophagus without dysplasia: Secondary | ICD-10-CM | POA: Diagnosis not present

## 2023-07-20 ENCOUNTER — Other Ambulatory Visit: Payer: Self-pay

## 2023-07-20 MED ORDER — OXYCODONE HCL 5 MG PO TABS
5.0000 mg | ORAL_TABLET | Freq: Three times a day (TID) | ORAL | 0 refills | Status: AC | PRN
  Filled 2023-07-20: qty 9, 3d supply, fill #0

## 2023-08-28 ENCOUNTER — Other Ambulatory Visit: Payer: Self-pay

## 2023-10-18 DIAGNOSIS — N200 Calculus of kidney: Secondary | ICD-10-CM | POA: Diagnosis not present

## 2023-10-18 DIAGNOSIS — E349 Endocrine disorder, unspecified: Secondary | ICD-10-CM | POA: Diagnosis not present

## 2023-10-31 DIAGNOSIS — Z23 Encounter for immunization: Secondary | ICD-10-CM | POA: Diagnosis not present

## 2023-10-31 DIAGNOSIS — Z1330 Encounter for screening examination for mental health and behavioral disorders, unspecified: Secondary | ICD-10-CM | POA: Diagnosis not present

## 2023-10-31 DIAGNOSIS — Z136 Encounter for screening for cardiovascular disorders: Secondary | ICD-10-CM | POA: Diagnosis not present

## 2023-10-31 DIAGNOSIS — R7309 Other abnormal glucose: Secondary | ICD-10-CM | POA: Diagnosis not present

## 2023-10-31 DIAGNOSIS — Z0000 Encounter for general adult medical examination without abnormal findings: Secondary | ICD-10-CM | POA: Diagnosis not present

## 2023-10-31 DIAGNOSIS — Z1331 Encounter for screening for depression: Secondary | ICD-10-CM | POA: Diagnosis not present

## 2023-10-31 DIAGNOSIS — R7989 Other specified abnormal findings of blood chemistry: Secondary | ICD-10-CM | POA: Diagnosis not present

## 2024-01-15 ENCOUNTER — Other Ambulatory Visit: Payer: Self-pay

## 2024-01-24 ENCOUNTER — Other Ambulatory Visit: Payer: Self-pay
